# Patient Record
Sex: Female | Born: 1975 | Race: White | Hispanic: No | Marital: Single | Smoking: Former smoker
Health system: Southern US, Community
[De-identification: ages and names within clinical notes are randomized; demographics above are authoritative.]

## PROBLEM LIST (undated history)

## (undated) DIAGNOSIS — F329 Major depressive disorder, single episode, unspecified: Secondary | ICD-10-CM

## (undated) DIAGNOSIS — G4730 Sleep apnea, unspecified: Secondary | ICD-10-CM

## (undated) DIAGNOSIS — K635 Polyp of colon: Secondary | ICD-10-CM

## (undated) DIAGNOSIS — F32A Depression, unspecified: Secondary | ICD-10-CM

## (undated) DIAGNOSIS — E039 Hypothyroidism, unspecified: Secondary | ICD-10-CM

## (undated) DIAGNOSIS — F419 Anxiety disorder, unspecified: Secondary | ICD-10-CM

## (undated) HISTORY — PX: CYST EXCISION: SHX5701

## (undated) HISTORY — DX: Anxiety disorder, unspecified: F41.9

## (undated) HISTORY — PX: FOOT FRACTURE SURGERY: SHX645

## (undated) HISTORY — PX: ANTERIOR AND POSTERIOR REPAIR: SHX1172

## (undated) HISTORY — DX: Sleep apnea, unspecified: G47.30

## (undated) HISTORY — DX: Major depressive disorder, single episode, unspecified: F32.9

## (undated) HISTORY — PX: PARTIAL HYSTERECTOMY: SHX80

## (undated) HISTORY — DX: Depression, unspecified: F32.A

## (undated) HISTORY — PX: TONSILLECTOMY AND ADENOIDECTOMY: SUR1326

## (undated) HISTORY — DX: Polyp of colon: K63.5

## (undated) HISTORY — DX: Hypothyroidism, unspecified: E03.9

---

## 2011-08-14 ENCOUNTER — Ambulatory Visit (HOSPITAL_BASED_OUTPATIENT_CLINIC_OR_DEPARTMENT_OTHER)
Admission: RE | Admit: 2011-08-14 | Discharge: 2011-08-14 | Disposition: A | Discharge status: Home / Self Care | Payer: Medicaid Other | Source: Ambulatory Visit | Attending: Orthopedic Surgery | Admitting: Orthopedic Surgery

## 2011-08-14 DIAGNOSIS — E039 Hypothyroidism, unspecified: Secondary | ICD-10-CM | POA: Insufficient documentation

## 2011-08-14 DIAGNOSIS — M6740 Ganglion, unspecified site: Secondary | ICD-10-CM | POA: Insufficient documentation

## 2011-08-14 DIAGNOSIS — F17200 Nicotine dependence, unspecified, uncomplicated: Secondary | ICD-10-CM | POA: Insufficient documentation

## 2011-08-15 ENCOUNTER — Other Ambulatory Visit: Payer: Self-pay | Admitting: Orthopedic Surgery

## 2011-08-23 NOTE — Op Note (Signed)
  NAMEAMAIYA, SCRUTON          ACCOUNT NO.:  [id number]  MEDICAL RECORD NO.:  [id number]  LOCATION:                                 FACILITY:  PHYSICIAN:  Cindee Salt, M.D.       DATE OF BIRTH:  [birth year]  DATE OF PROCEDURE:  08/14/2011 DATE OF DISCHARGE:                              OPERATIVE REPORT   PREOPERATIVE DIAGNOSIS:  Dorsal wrist ganglion, right wrist.  POSTOPERATIVE DIAGNOSIS:  Dorsal wrist ganglion, right wrist.  OPERATION:  Excisional biopsy, dorsal wrist ganglion, right wrist.  SURGEON:  Cindee Salt, MD  ANESTHESIA:  General.  ANESTHESIOLOGIST:  Germaine Pomfret, MD  HISTORY:  The patient is a 35 year old female with a history of a large mass over the dorsal aspect of her right wrist.  She is desirous to having this removed.  Pre, peri, and postoperative course have been discussed along with risks and complications.  She is aware there is no guarantee with surgery, possibility of infection, recurrence to injuryto arteries, nerves, tendons, incomplete relief of symptoms and dystrophy.  In the preoperative area, the patient is seen, the extremity marked by both the patient and surgeon, and antibiotic given.  PROCEDURE:  The patient was brought to the operating room where general anesthetic was carried out without difficulty.  She was prepped using ChloraPrep, supine position, right arm free.  A 3-minute dry-time was allowed.  Time-out taken, confirming the patient and procedure.  A transverse incision was made over the mass, carried down through the subcutaneous tissue.  Bleeders were electrocauterized with bipolar. Dorsal sensory nerves were identified and protected.  Dissection carried down, following it through the extensor retinaculum, which it had split. This was followed with down to the scapholunate ligament complex.  This was opened.  A stalk followed, excised.  The capsule was opened and debrided.  The wound was irrigated.  The capsule was then  closed with figure-of-eight 4-0 Vicryl sutures, subcutaneous tissue and retinaculum and in layers with 4-0 Vicryl and the skin with subcuticular 4-0 Vicryl Rapide sutures.  A local infiltration of 0.25% Marcaine without epinephrine was given, approximately 5 mL was used.  A sterile compressive dressing and dorsal volar splint was applied.  On deflation of the tourniquet, all fingers were immediately pinked.  She was taken to the recovery room for observation in satisfactory condition.  She will be discharged to home to return to the [facility] in 1 week, on Vicodin.          ______________________________ Cindee Salt, M.D.     GK/MEDQ  D:  08/14/2011  T:  08/15/2011  Job:  [id number]  Electronically Signed by Cindee Salt M.D. on 08/23/2011 03:49:12 PM

## 2012-02-19 ENCOUNTER — Ambulatory Visit: Payer: Medicaid Other | Admitting: Internal Medicine

## 2012-03-03 ENCOUNTER — Ambulatory Visit: Payer: Medicaid Other | Admitting: Gastroenterology

## 2012-03-03 ENCOUNTER — Telehealth: Payer: Self-pay | Admitting: Gastroenterology

## 2012-03-03 NOTE — Telephone Encounter (Signed)
Message copied by Arna Snipe on Tue Mar 03, 2012  3:41 PM ------      Message from: Donata Duff      Created: Tue Mar 03, 2012  2:27 PM       Do not bill

## 2012-12-22 ENCOUNTER — Encounter: Payer: Self-pay | Admitting: Gastroenterology

## 2013-01-01 ENCOUNTER — Encounter: Payer: Self-pay | Admitting: *Deleted

## 2013-01-05 ENCOUNTER — Encounter: Payer: Medicaid Other | Admitting: Obstetrics & Gynecology

## 2013-01-08 ENCOUNTER — Ambulatory Visit (INDEPENDENT_AMBULATORY_CARE_PROVIDER_SITE_OTHER): Payer: Medicaid Other | Admitting: Gastroenterology

## 2013-01-08 ENCOUNTER — Encounter: Payer: Self-pay | Admitting: Gastroenterology

## 2013-01-08 VITALS — BP 118/78 | HR 73 | Ht 64.0 in | Wt 174.0 lb

## 2013-01-08 DIAGNOSIS — R194 Change in bowel habit: Secondary | ICD-10-CM

## 2013-01-08 DIAGNOSIS — Z860100 Personal history of colon polyps, unspecified: Secondary | ICD-10-CM

## 2013-01-08 DIAGNOSIS — K5900 Constipation, unspecified: Secondary | ICD-10-CM

## 2013-01-08 DIAGNOSIS — R198 Other specified symptoms and signs involving the digestive system and abdomen: Secondary | ICD-10-CM

## 2013-01-08 DIAGNOSIS — M549 Dorsalgia, unspecified: Secondary | ICD-10-CM

## 2013-01-08 DIAGNOSIS — Z8719 Personal history of other diseases of the digestive system: Secondary | ICD-10-CM

## 2013-01-08 MED ORDER — MOVIPREP 100 G PO SOLR: 1.0000 | Freq: Once | ORAL | Status: DC

## 2013-01-08 MED ORDER — LINACLOTIDE 290 MCG PO CAPS: 1.0000 | ORAL_CAPSULE | Freq: Every day | ORAL | Status: DC

## 2013-01-08 NOTE — Progress Notes (Addendum)
History of Present Illness:  This is a nice 37 year old Caucasian female status post hysterectomy who has some low-grade chronic pelvic pain of unexplained etiology.  She also describes pain in her sacroiliac area made better by having a bowel movement.  She has chronic functional constipation with occasional diarrhea, but denies melena or hematochezia.  She is status post 2 colonoscopies with removal of adenomatous colon polyps, and is due for followup exam at this time.  Her previous exams weren't [facility].  The patient has a history of colon polyps in her father.  There is no history of hepatitis, pancreatitis, and she denies upper GI or hepatobiliary complaints or any specific food allergies, anorexia, weight loss, or systemic complaints.  He is hypothyroid and is on daily Synthroid.  Other problems include mild depression and anxiety, and she is on citalopram 40 mg a day and BuSpar in milligrams once a day.  She denies a history of rectal fissures, fistula, hemorrhoids, or rectal pain.  She has an upcoming gynecologic appointment scheduled.  I have reviewed this patient's present history, medical and surgical past history, allergies and medications.     ROS:   All systems were reviewed and are negative unless otherwise stated in the HPI.    Physical Exam: Healthy-appearing patient in no distress.  Blood pressure 118/78, pulse 73 and weight 174 with a BMI of 29.85.  General well developed well nourished patient in no acute distress, appearing their stated age Eyes PERRLA, no icterus, fundoscopic exam per opthamologist Skin no lesions noted Neck supple, no adenopathy, no thyroid enlargement, no tenderness Chest clear to percussion and auscultation Heart no significant murmurs, gallops or rubs noted Abdomen no hepatosplenomegaly masses or tenderness, BS normal.  Rectal inspection normal no fissures, or fistulae noted.  No masses or tenderness on digital exam. Stool guaiac negative.   Good rectal tone noted without fissures or fistulae, masses or tenderness. Extremities no acute joint lesions, edema, phlebitis or evidence of cellulitis. Neurologic patient oriented x 3, cranial nerves intact, no focal neurologic deficits noted. Psychological mental status normal and normal affect.  Assessment and plan: Chronic functional constipation in a patient has a history of recurrent colon polyps.  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  Colonoscopies but scheduled her convenience.  I've urged her to keep her gynecologic appointment and to take other medications as listed and reviewed.  No diagnosis found.

## 2013-01-08 NOTE — Patient Instructions (Addendum)
You have been given a separate informational sheet regarding your tobacco use, the importance of quitting and local resources to help you quit.  You have been scheduled for a colonoscopy with propofol. Please follow written instructions given to you at your visit today.  Please pick up your prep kit at the pharmacy within the next 1-3 days. If you use inhalers (even only as needed), please bring them with you on the day of your procedure.  Please purchase Metamucil over the counter. Take as directed.  Please follow high fiber diet below. ________________________________________________________________________________________  High-Fiber Diet Fiber is found in fruits, vegetables, and grains. A high-fiber diet encourages the addition of more whole grains, legumes, fruits, and vegetables in your diet. The recommended amount of fiber for adult males is 38 g per day. For adult females, it is 25 g per day. Pregnant and lactating women should get 28 g of fiber per day. If you have a digestive or bowel problem, ask your caregiver for advice before adding high-fiber foods to your diet. Eat a variety of high-fiber foods instead of only a select few type of foods.  PURPOSE  To increase stool bulk.  To make bowel movements more regular to prevent constipation.  To lower cholesterol.  To prevent overeating. WHEN IS THIS DIET USED?  It may be used if you have constipation and hemorrhoids.  It may be used if you have uncomplicated diverticulosis (intestine condition) and irritable bowel syndrome.  It may be used if you need help with weight management.  It may be used if you want to add it to your diet as a protective measure against atherosclerosis, diabetes, and cancer. SOURCES OF FIBER  Whole-grain breads and cereals.  Fruits, such as apples, oranges, bananas, berries, prunes, and pears.  Vegetables, such as green peas, carrots, sweet potatoes, beets, broccoli, cabbage, spinach, and  artichokes.  Legumes, such split peas, soy, lentils.  Almonds. FIBER CONTENT IN FOODS Starches and Grains / Dietary Fiber (g)  Cheerios, 1 cup / 3 g  Corn Flakes cereal, 1 cup / 0.7 g  Rice crispy treat cereal, 1 cup / 0.3 g  Instant oatmeal (cooked),  cup / 2 g  Frosted wheat cereal, 1 cup / 5.1 g  Brown, long-grain rice (cooked), 1 cup / 3.5 g  White, long-grain rice (cooked), 1 cup / 0.6 g  Enriched macaroni (cooked), 1 cup / 2.5 g Legumes / Dietary Fiber (g)  Baked beans (canned, plain, or vegetarian),  cup / 5.2 g  Kidney beans (canned),  cup / 6.8 g  Pinto beans (cooked),  cup / 5.5 g Breads and Crackers / Dietary Fiber (g)  Plain or honey graham crackers, 2 squares / 0.7 g  Saltine crackers, 3 squares / 0.3 g  Plain, salted pretzels, 10 pieces / 1.8 g  Whole-wheat bread, 1 slice / 1.9 g  White bread, 1 slice / 0.7 g  Raisin bread, 1 slice / 1.2 g  Plain bagel, 3 oz / 2 g  Flour tortilla, 1 oz / 0.9 g  Corn tortilla, 1 small / 1.5 g  Hamburger or hotdog bun, 1 small / 0.9 g Fruits / Dietary Fiber (g)  Apple with skin, 1 medium / 4.4 g  Sweetened applesauce,  cup / 1.5 g  Banana,  medium / 1.5 g  Grapes, 10 grapes / 0.4 g  Orange, 1 small / 2.3 g  Raisin, 1.5 oz / 1.6 g  Melon, 1 cup / 1.4 g Vegetables /  Dietary Fiber (g)  Green beans (canned),  cup / 1.3 g  Carrots (cooked),  cup / 2.3 g  Broccoli (cooked),  cup / 2.8 g  Peas (cooked),  cup / 4.4 g  Mashed potatoes,  cup / 1.6 g  Lettuce, 1 cup / 0.5 g  Corn (canned),  cup / 1.6 g  Tomato,  cup / 1.1 g Document Released: 10/07/2005 Document Revised: 04/07/2012 Document Reviewed: 01/09/2012 ExitCare Patient Information 2013 ExitCare, Arsenio Loader. _____________________________________________________________________________________________________________________                                               We are excited to introduce MyChart, a new best-in-class  service that provides you online access to important information in your electronic medical record. We want to make it easier for you to view your health information - all in one secure location - when and where you need it. We expect MyChart will enhance the quality of care and service we provide.  When you register for MyChart, you can:    View your test results.    Request appointments and receive appointment reminders via email.    Request medication renewals.    View your medical history, allergies, medications and immunizations.    Communicate with your physician's office through a password-protected site.    Conveniently print information such as your medication lists.  To find out if MyChart is right for you, please talk to a member of our clinical staff today. We will gladly answer your questions about this free health and wellness tool.  If you are age 61 or older and want a member of your family to have access to your record, you must provide written consent by completing a proxy form available at our office. Please speak to our clinical staff about guidelines regarding accounts for patients younger than age 58.  As you activate your MyChart account and need any technical assistance, please call the MyChart technical support line at [phone]) or email your question to [email or web address]. If you email your question(s), please include your name, a return phone number and the best time to reach you.  If you have non-urgent health-related questions, you can send a message to our office through MyChart at [email or web address]. If you have a medical emergency, call 911.  Thank you for using MyChart as your new health and wellness resource!   MyChart licensed from [employer],  [id number]. Patents Pending.

## 2013-01-11 ENCOUNTER — Encounter: Payer: Medicaid Other | Admitting: Gastroenterology

## 2013-01-11 ENCOUNTER — Encounter: Payer: Self-pay | Admitting: *Deleted

## 2013-01-11 ENCOUNTER — Telehealth: Payer: Self-pay | Admitting: Nurse Practitioner

## 2013-01-11 NOTE — Telephone Encounter (Signed)
APPT MADE FOR TOM

## 2013-01-11 NOTE — Telephone Encounter (Signed)
Needs to be seen today.  Her lower back is hurting really bad.

## 2013-01-11 NOTE — Progress Notes (Signed)
Patient ID: Krystal Mckee, female   DOB: Oct 09, 1976, 37 y.o.   MRN: [id number] PT REFUSED APPT FOR TOM.

## 2013-01-12 ENCOUNTER — Encounter: Payer: Self-pay | Admitting: Obstetrics & Gynecology

## 2013-01-12 ENCOUNTER — Ambulatory Visit (INDEPENDENT_AMBULATORY_CARE_PROVIDER_SITE_OTHER): Payer: Medicaid Other | Admitting: Obstetrics & Gynecology

## 2013-01-12 VITALS — BP 106/68 | HR 77 | Resp 16 | Ht 64.0 in | Wt 175.0 lb

## 2013-01-12 DIAGNOSIS — IMO0002 Reserved for concepts with insufficient information to code with codable children: Secondary | ICD-10-CM | POA: Insufficient documentation

## 2013-01-12 DIAGNOSIS — K589 Irritable bowel syndrome without diarrhea: Secondary | ICD-10-CM

## 2013-01-12 NOTE — Patient Instructions (Signed)
Dyspareunia Dyspareunia is pain during sexual intercourse. It is most common in women, but it also happens in men.  CAUSES  Female The pain from this condition is usually felt when anything is put into the vagina, but any part of the genitals may cause pain during sex. Even sitting or wearing pants can cause pain. Sometimes, a cause cannot be found. Some causes of pain during intercourse are:  Infections of the skin around the vagina.  Vaginal infections, such as a yeast, bacterial, or viral infection.  Vaginismus. This is the inability to have anything put in the vagina even when the woman wants it to happen. There is an automatic muscle contraction and pain. The pain of the muscle contraction can be so severe that intercourse is impossible.  Allergic reaction from spermicides, semen, condoms, scented tampons, soaps, douches, and vaginal sprays.  A fluid-filled sac (cyst) on the Bartholin or Skene glands, located at the opening of the vagina.  Scar tissue in the vagina from a surgically enlarged opening (episiotomy) or tearing after delivering a baby.  Vaginal dryness. This is more common in menopause. The normal secretions of the vagina are decreased. Changes in estrogen levels and increased difficulty becoming aroused can cause painful sex. Vaginal dryness can also happen when taking birth control pills.  Thinning of the tissue (atrophy) of the vulva and vagina. This makes the area thinner, smaller, unable to stretch to accommodate a penis, and prone to infection and tearing.  Vulvar vestibulitis or vestibulodynia.This is a condition that causes pain involving the area around the entrance to the vagina.The most common cause in young women is birth control pills.Women with low estrogen levels (postmenopausal women) may also experience this.Other causes include allergic reactions, too many nerve endings, skin conditions, and pelvic muscles that cannot relax.  Vulvar dermatoses. This  includes skin conditions such as lichen sclerosus and lichen planus.  Lack of foreplay to lubricate the vagina. This can cause vaginal dryness.  Noncancerous tumors (fibroids) in the uterus.  Uterus lining tissue growing outside the uterus (endometriosis).  Pregnancy that starts in the fallopian tube (tubal pregnancy).  Pregnancy or breastfeeding your baby. This can cause vaginal dryness.  A tilting or prolapse of the uterus. Prolapse is when weak and stretched muscles around the uterus allow it to fall into the vagina.  Problems with the ovaries, cysts, or scar tissue. This may be worse with certain sexual positions.  Previous surgeries causing adhesions or scar tissue in the vagina or pelvis.  Bladder and intestinal problems.  Psychological problems (such as depression or anxiety). This may make pain worse.  Negative attitudes about sex, experiencing rape, sexual assault, and misinformation about sex. These issues are often related to some types of pain.  Previous pelvic infection, causing scar tissue in the pelvis and on the female organs.  Cyst or tumor on the ovary.  Cancer of the female organs.  Certain medicines.  Medical problems such as diabetes, arthritis, or thyroid disease. Female In men, there are many physical causes of sexual discomfort. Some causes of pain during intercourse are:  Infections of the prostate, bladder, or seminal vesicles. This can cause pain after ejaculation.  An inflamed bladder (interstitial cystitis). This may cause pain from ejaculation.  Gonorrheal infections. This may cause pain during ejaculation.  An inflamed urethra (urethritis) or inflamed prostate (prostatitis). This can make genital stimulation painful or uncomfortable.  Deformities of the penis, such as Peyronie's disease.  A tight foreskin.  Cancer of the female organs.    Psychological problems. This may make pain worse. DIAGNOSIS   Your caregiver will take a history and  have you describe where the pain is located (outside the vagina, in the vagina, in the pelvis). You may be asked when you experience pain, such as with penetration or with thrusting.  Following this, your caregiver will do a physical exam. Let your caregiver know if the exam is too painful.  During the final part of the female exam, your caregiver will feel your uterus and ovaries with one hand on the abdomen and one finger in your vagina. This is a pelvic exam.  Blood tests, a Pap test, cultures for infection, an ultrasound test, and X-rays may be done. You may need to see a specialist for female problems (gynecologist).  Your caregiver may do a CT scan, MRI, or laparoscopy. Laparoscopy is a procedure to look into the pelvis with a lighted tube, through a cut (incision) in the abdomen. TREATMENT  Your caregiver can help you determine the best course of treatment. Sometimes, more testing is done. Continue with the suggested testing until your caregiver feels sure about your diagnosis and how to treat it. Sometimes, it is difficult to find the reason for the pain. The search for the cause and treatment can be frustrating. Treatment often takes several weeks to a few months before you notice any improvement. You may also need to avoid sexual activity until symptoms improve.Continuing to have sex when it hurts can delay healing and actually make the problem worse. The treatment depends on the cause of the pain. Treatment may include:  Medicines such as antibiotics, vaginal or skin creams, hormones, or antidepressants.  Minor or major surgery.  Psychological counseling or group therapy.  Kegel exercises and vaginal dilators to help certain cases of vaginismus (spasms). Do this only if recommended by your caregiver.Kegel exercises can make some problems worse.  Applying lubrication as recommended by your caregiver if you have dryness.  Sex therapy for you and your sex partner. It is common for  the pain to continue after the reason for the pain has been treated. Some reasons for this include a conditioned response. This means the person having the pain becomes so familiar with the pain that the pain continues as a response, even though the cause is removed. Sex therapy can help with this problem. HOME CARE INSTRUCTIONS   Follow your caregiver's instructions about taking medicines, tests, counseling, and follow-up treatment.  Do not use scented tampons, douches, vaginal sprays, or soaps.  Use water-based lubricants for dryness. Oil lubricants can cause irritation.  Do not use spermicides or condoms that irritate you.  Openly discuss with your partner your sexual experience, your desires, foreplay, and different sexual positions for a more comfortable and enjoyable sexual relationship.  Join group sessions for therapy, if needed.  Practice safe sex at all times.  Empty your bladder before having intercourse.  Try different positions during sexual intercourse.  Take over-the-counter pain medicine recommended by your caregiver before having sexual intercourse.  Do not wear pantyhose. Knee-high and thigh-high hose are okay.  Avoid scrubbing your vulva with a washcloth. Wash the area gently and pat dry with a towel. SEEK MEDICAL CARE IF:   You develop vaginal bleeding after sexual intercourse.  You develop a lump at the opening of your vagina, even if it is not painful.  You have abnormal vaginal discharge.  You have vaginal dryness.  You have itching or irritation of the vulva or vagina.  You   develop a rash or reaction to your medicine. SEEK IMMEDIATE MEDICAL CARE IF:   You develop severe abdominal pain during or shortly after sexual intercourse. You could have a ruptured ovarian cyst or ruptured tubal pregnancy.  You have a fever.  You have painful or bloody urination.  You have painful sexual intercourse, and you never had it before.  You pass out after having  sexual intercourse. Document Released: 10/27/2007 Document Revised: 12/30/2011 Document Reviewed: 01/07/2011 ExitCare Patient Information 2013 [employer].  

## 2013-01-12 NOTE — Progress Notes (Signed)
Patient ID: Krystal Mckee, female   DOB: 1976/02/01, 37 y.o.   MRN: [id number]  Chief Complaint  Patient presents with  . Dyspareunia    Last year has gotten worse    HPI Krystal Mckee is a 37 y.o. female.  [id number] No LMP recorded. Patient has had a hysterectomy. TVH Aand P repair in [place] several months after SVD 9 lb 15 oz baby, previous birth was CS. She had had pelvic pain with intercourse almost ever since and this is now worse. No discharge, prolapse, dysuria or frequency. HPI  Past Medical History  Diagnosis Date  . Depression   . Anxiety   . Hypothyroidism   . Colon polyps     Past Surgical History  Procedure Laterality Date  . Partial hysterectomy    . Tonsillectomy and adenoidectomy    . Cyst excision      Right wrist  . Cyst excision      Left knee  . Cesarean section      Family History  Problem Relation Age of Onset  . Colon polyps Father   . Ovarian cancer Mother   . Diabetes Maternal Grandmother     Social History History  Substance Use Topics  . Smoking status: Current Every Day Smoker  . Smokeless tobacco: Never Used  . Alcohol Use: No    Allergies  Allergen Reactions  . Penicillins     Current Outpatient Prescriptions  Medication Sig Dispense Refill  . busPIRone (BUSPAR) 10 MG tablet Take 10 mg by mouth daily.      . citalopram (CELEXA) 40 MG tablet Take 40 mg by mouth daily.      Marland Kitchen levothyroxine (SYNTHROID) 25 MCG tablet Take 25 mcg by mouth daily.      . Linaclotide (LINZESS) 290 MCG CAPS Take 1 capsule by mouth daily.  18 capsule  0  . MOVIPREP 100 G SOLR Take 1 kit (100 g total) by mouth once.  1 kit  0   No current facility-administered medications for this visit.    Review of Systems Review of Systems  Constitutional: Negative.   Genitourinary: Positive for pelvic pain (dyspareunia, occasionally with certain movements). Negative for vaginal bleeding, vaginal discharge and vaginal pain.    Blood pressure 106/68,  pulse 77, resp. rate 16, height 5\' 4"  (1.626 m), weight 175 lb (79.379 kg).  Physical Exam Physical Exam  Constitutional: She is oriented to person, place, and time. She appears well-developed. No distress.  Abdominal: Soft. There is no tenderness.  Transverse scar low abd  Genitourinary: Vagina normal.  Cuff good support, no mass, mild s/p tenderness  Neurological: She is alert and oriented to person, place, and time.  Skin: Skin is warm and dry.  Psychiatric: She has a normal mood and affect. Her behavior is normal.    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

## 2013-01-18 ENCOUNTER — Other Ambulatory Visit (HOSPITAL_COMMUNITY): Payer: Medicaid Other

## 2013-01-18 ENCOUNTER — Ambulatory Visit (AMBULATORY_SURGERY_CENTER): Payer: Medicaid Other | Admitting: Gastroenterology

## 2013-01-18 ENCOUNTER — Encounter: Payer: Self-pay | Admitting: Gastroenterology

## 2013-01-18 ENCOUNTER — Other Ambulatory Visit: Payer: Self-pay | Admitting: *Deleted

## 2013-01-18 VITALS — BP 110/70 | HR 62 | Temp 97.3°F | Resp 18 | Ht 64.0 in | Wt 174.0 lb

## 2013-01-18 DIAGNOSIS — Z1211 Encounter for screening for malignant neoplasm of colon: Secondary | ICD-10-CM

## 2013-01-18 DIAGNOSIS — Z86010 Personal history of colonic polyps: Secondary | ICD-10-CM

## 2013-01-18 DIAGNOSIS — R079 Chest pain, unspecified: Secondary | ICD-10-CM

## 2013-01-18 DIAGNOSIS — K589 Irritable bowel syndrome without diarrhea: Secondary | ICD-10-CM

## 2013-01-18 HISTORY — PX: COLONOSCOPY: SHX174

## 2013-01-18 MED ORDER — SODIUM CHLORIDE 0.9 % IV SOLN: 500.0000 mL | INTRAVENOUS | Status: DC

## 2013-01-18 NOTE — Patient Instructions (Addendum)
YOU HAD AN ENDOSCOPIC PROCEDURE TODAY AT THE [facility]: Refer to the procedure report that was given to you for any specific questions about what was found during the examination.  If the procedure report does not answer your questions, please call your gastroenterologist to clarify.  If you requested that your care partner not be given the details of your procedure findings, then the procedure report has been included in a sealed envelope for you to review at your convenience later.  YOU SHOULD EXPECT: Some feelings of bloating in the abdomen. Passage of more gas than usual.  Walking can help get rid of the air that was put into your GI tract during the procedure and reduce the bloating. If you had a lower endoscopy (such as a colonoscopy or flexible sigmoidoscopy) you may notice spotting of blood in your stool or on the toilet paper. If you underwent a bowel prep for your procedure, then you may not have a normal bowel movement for a few days.  DIET: Your first meal following the procedure should be a light meal and then it is ok to progress to your normal diet.  A half-sandwich or bowl of soup is an example of a good first meal.  Heavy or fried foods are harder to digest and may make you feel nauseous or bloated.  Likewise meals heavy in dairy and vegetables can cause extra gas to form and this can also increase the bloating.  Drink plenty of fluids but you should avoid alcoholic beverages for 24 hours.  ACTIVITY: Your care partner should take you home directly after the procedure.  You should plan to take it easy, moving slowly for the rest of the day.  You can resume normal activity the day after the procedure however you should NOT DRIVE or use heavy machinery for 24 hours (because of the sedation medicines used during the test).    SYMPTOMS TO REPORT IMMEDIATELY: A gastroenterologist can be reached at any hour.  During normal business hours, 8:30 AM to 5:00 PM Monday through Friday,  call [phone].  After hours and on weekends, please call the GI answering service at [phone] who will take a message and have the physician on call contact you.   Following lower endoscopy (colonoscopy or flexible sigmoidoscopy):  Excessive amounts of blood in the stool  Significant tenderness or worsening of abdominal pains  Swelling of the abdomen that is new, acute  Fever of 100F or higher  Following upper endoscopy (EGD)  Vomiting of blood or coffee ground material  New chest pain or pain under the shoulder blades  Painful or persistently difficult swallowing  New shortness of breath  Fever of 100F or higher  Black, tarry-looking stools  FOLLOW UP: If any biopsies were taken you will be contacted by phone or by letter within the next 1-3 weeks.  Call your gastroenterologist if you have not heard about the biopsies in 3 weeks.  Our staff will call the home number listed on your records the next business day following your procedure to check on you and address any questions or concerns that you may have at that time regarding the information given to you following your procedure. This is a courtesy call and so if there is no answer at the home number and we have not heard from you through the emergency physician on call, we will assume that you have returned to your regular daily activities without incident.  SIGNATURES/CONFIDENTIALITY: You and/or your care   partner have signed paperwork which will be entered into your electronic medical record.  These signatures attest to the fact that that the information above on your After Visit Summary has been reviewed and is understood.  Full responsibility of the confidentiality of this discharge information lies with you and/or your care-partner.  

## 2013-01-18 NOTE — Progress Notes (Signed)
Patient did not experience any of the following events: a burn prior to discharge; a fall within the facility; wrong site/side/patient/procedure/implant event; or a hospital transfer or hospital admission upon discharge from the facility. (G8907) Patient did not have preoperative order for IV antibiotic SSI prophylaxis. (G8918)  

## 2013-01-18 NOTE — Op Note (Signed)
[facility] [address] [place], [address]   COLONOSCOPY PROCEDURE REPORT  PATIENT: Krystal Mckee, Krystal Mckee  MR#: [id number] BIRTHDATE: 1976-02-12 , 36  yrs. old GENDER: Female ENDOSCOPIST: Mardella Layman, MD, [facility] REFERRED BY:  Wardell Heath, N.P. PROCEDURE DATE:  01/18/2013 PROCEDURE:   Colonoscopy, screening ASA CLASS:   Class II INDICATIONS:Colorectal cancer screening and Constipation. MEDICATIONS: propofol (Diprivan) 200mg  IV and Robinul 0.2 mg IV  DESCRIPTION OF PROCEDURE:   After the risks and benefits and of the procedure were explained, informed consent was obtained.  A digital rectal exam revealed no abnormalities of the rectum.    The LB [id number]  endoscope was introduced through the anus and advanced to the cecum, which was identified by both the appendix and ileocecal valve .  The quality of the prep was excellent, using MoviPrep .  The instrument was then slowly withdrawn as the colon was fully examined.     COLON FINDINGS: A normal appearing cecum, ileocecal valve, and appendiceal orifice were identified.  The ascending, hepatic flexure, transverse, splenic flexure, descending, sigmoid colon and rectum appeared unremarkable.  No polyps or cancers were seen. The scope was then withdrawn from the patient and the procedure completed.  COMPLICATIONS: There were no complications. ENDOSCOPIC IMPRESSION: Normal colon ..no polyps,IBd,etc..Marland KitchenClinical picture c/w slow-transit constipation.  RECOMMENDATIONS: 1.  Continue current medications 2.  Continue current colorectal screening recommendations for "routine risk" patients with a repeat colonoscopy in 10 years.   REPEAT EXAM:  cc:  _______________________________ eSignedMardella Layman, MD, [facility] 01/18/2013 3:15 PM

## 2013-01-19 ENCOUNTER — Ambulatory Visit (HOSPITAL_COMMUNITY): Payer: Medicaid Other

## 2013-01-19 ENCOUNTER — Telehealth: Payer: Self-pay | Admitting: *Deleted

## 2013-01-19 ENCOUNTER — Ambulatory Visit (HOSPITAL_COMMUNITY)
Admission: RE | Admit: 2013-01-19 | Discharge: 2013-01-19 | Disposition: A | Discharge status: Home / Self Care | Payer: Medicaid Other | Source: Ambulatory Visit | Attending: Obstetrics & Gynecology | Admitting: Obstetrics & Gynecology

## 2013-01-19 DIAGNOSIS — IMO0002 Reserved for concepts with insufficient information to code with codable children: Secondary | ICD-10-CM | POA: Insufficient documentation

## 2013-01-19 NOTE — Telephone Encounter (Signed)
  Follow up Call-  Call back number 01/18/2013  Post procedure Call Back phone  # [phone]  Permission to leave phone message Yes     Patient questions:  Left message to call if necessary.

## 2013-01-26 ENCOUNTER — Encounter: Payer: Self-pay | Admitting: Obstetrics & Gynecology

## 2013-01-26 ENCOUNTER — Ambulatory Visit (INDEPENDENT_AMBULATORY_CARE_PROVIDER_SITE_OTHER): Payer: Medicaid Other | Admitting: Obstetrics & Gynecology

## 2013-01-26 VITALS — BP 114/72 | HR 88 | Resp 16 | Wt 174.0 lb

## 2013-01-26 DIAGNOSIS — IMO0002 Reserved for concepts with insufficient information to code with codable children: Secondary | ICD-10-CM

## 2013-01-26 MED ORDER — GABAPENTIN 300 MG PO CAPS: ORAL_CAPSULE | ORAL | Status: DC

## 2013-01-26 NOTE — Progress Notes (Signed)
  Subjective:    Patient ID: Krystal Mckee, female    DOB: 1976/07/08, 37 y.o.   MRN: [id number]  HPI [id number] No LMP recorded. Patient has had a hysterectomy. Pelvic US and colonoscopy were normal. Her only urinary sx is urge to urinate during intercourse.     Review of Systems  Genitourinary: Positive for pelvic pain and dyspareunia. Negative for dysuria, urgency and vaginal discharge.       Objective:   Physical Exam  Nursing note and vitals reviewed. Constitutional: She appears well-developed. No distress.  Psychiatric: She has a normal mood and affect. Her behavior is normal.          Assessment & Plan:  Dyspareunia after TVH  A&P repair, nl ovaries on [place] Constipaition, mood d/o on meds  Trial of gabapentin RTC 6 weeks   Adam Phenix, MD 01/26/2013

## 2013-01-26 NOTE — Patient Instructions (Signed)
Gabapentin capsules or tablets What is this medicine? GABAPENTIN (GA ba pen tin) is used to control partial seizures in adults with epilepsy. It is also used to treat certain types of nerve pain. This medicine may be used for other purposes; ask your health care provider or pharmacist if you have questions. What should I tell my health care provider before I take this medicine? They need to know if you have any of these conditions: -kidney disease -suicidal thoughts, plans, or attempt; a previous suicide attempt by you or a family member -an unusual or allergic reaction to gabapentin, other medicines, foods, dyes, or preservatives -pregnant or trying to get pregnant -breast-feeding How should I use this medicine? Take this medicine by mouth. Swallow it with a drink of water. Follow the directions on the prescription label. If this medicine upsets your stomach, take it with food or milk. Take your medicine at regular intervals. Do not take it more often than directed. If you are directed to break the 600 or 800 mg tablets in half as part of your dose, the extra half tablet should be used for the next dose. If you have not used the extra half tablet within 3 days, it should be thrown away. A special MedGuide will be given to you by the pharmacist with each prescription and refill. Be sure to read this information carefully each time. Talk to your pediatrician regarding the use of this medicine in children. Special care may be needed. Overdosage: If you think you have taken too much of this medicine contact a poison control center or emergency room at once. NOTE: This medicine is only for you. Do not share this medicine with others. What if I miss a dose? If you miss a dose, take it as soon as you can. If it is almost time for your next dose, take only that dose. Do not take double or extra doses. What may interact with this medicine? -antacids -hydrocodone -morphine -naproxen -sevelamer This  list may not describe all possible interactions. Give your health care provider a list of all the medicines, herbs, non-prescription drugs, or dietary supplements you use. Also tell them if you smoke, drink alcohol, or use illegal drugs. Some items may interact with your medicine. What should I watch for while using this medicine? Visit your doctor or health care professional for regular checks on your progress. You may want to keep a record at home of how you feel your condition is responding to treatment. You may want to share this information with your doctor or health care professional at each visit. You should contact your doctor or health care professional if your seizures get worse or if you have any new types of seizures. Do not stop taking this medicine or any of your seizure medicines unless instructed by your doctor or health care professional. Stopping your medicine suddenly can increase your seizures or their severity. Wear a medical identification bracelet or chain if you are taking this medicine for seizures, and carry a card that lists all your medications. You may get drowsy, dizzy, or have blurred vision. Do not drive, use machinery, or do anything that needs mental alertness until you know how this medicine affects you. To reduce dizzy or fainting spells, do not sit or stand up quickly, especially if you are an older patient. Alcohol can increase drowsiness and dizziness. Avoid alcoholic drinks. Your mouth may get dry. Chewing sugarless gum or sucking hard candy, and drinking plenty of water will help.   The use of this medicine may increase the chance of suicidal thoughts or actions. Pay special attention to how you are responding while on this medicine. Any worsening of mood, or thoughts of suicide or dying should be reported to your health care professional right away. Women who become pregnant while using this medicine may enroll in the [place] American Antiepileptic Drug Pregnancy Registry  by calling 1-888-233-2334. This registry collects information about the safety of antiepileptic drug use during pregnancy. What side effects may I notice from receiving this medicine? Side effects that you should report to your doctor or health care professional as soon as possible: -allergic reactions like skin rash, itching or hives, swelling of the face, lips, or tongue -worsening of mood, thoughts or actions of suicide or dying Side effects that usually do not require medical attention (report to your doctor or health care professional if they continue or are bothersome): -constipation -difficulty walking or controlling muscle movements -nausea -slurred speech -tremors -weight gain This list may not describe all possible side effects. Call your doctor for medical advice about side effects. You may report side effects to FDA at 1-800-FDA-1088. Where should I keep my medicine? Keep out of reach of children. Store at room temperature between 15 and 30 degrees C (59 and 86 degrees F). Throw away any unused medicine after the expiration date. NOTE: This sheet is a summary. It may not cover all possible information. If you have questions about this medicine, talk to your doctor, pharmacist, or health care provider.  2012, Elsevier/Gold Standard. (06/05/2010 6:06:26 PM) 

## 2013-02-18 ENCOUNTER — Ambulatory Visit (INDEPENDENT_AMBULATORY_CARE_PROVIDER_SITE_OTHER): Payer: Medicaid Other | Admitting: Internal Medicine

## 2013-02-18 ENCOUNTER — Encounter: Payer: Self-pay | Admitting: Internal Medicine

## 2013-02-18 VITALS — BP 108/75 | HR 87 | Ht 64.0 in | Wt 177.2 lb

## 2013-02-18 DIAGNOSIS — R55 Syncope and collapse: Secondary | ICD-10-CM | POA: Insufficient documentation

## 2013-02-18 HISTORY — DX: Syncope and collapse: R55

## 2013-02-18 NOTE — Patient Instructions (Addendum)
Your physician recommends that you schedule a follow-up appointment in: 3-4 months with Dr Taylor  

## 2013-02-18 NOTE — Assessment & Plan Note (Signed)
The patient almost certainly has autonomic dysfunction. I discussed the mechanism of autonomic dysfunction with her in detail, as well as ways that she might reduce her episodes of syncope. I do not think head up tilt table testing would be helpful. I've asked the patient to increase her salt and fluid intake, and to avoid caffeine and alcohol. She is instructed to stop smoking and to start exercising daily. Finally, I've warned the patient to lie down when she feels an episode coming. Hopefully with all the above, her symptoms will be more manageable. I'll see her back in several months to see how she is progressing.

## 2013-02-18 NOTE — Progress Notes (Signed)
HPI Krystal Mckee is referred today by Dr. Jarold Motto for evaluation of bradycardia and syncope. The patient is a very pleasant 37 year old woman who's had a long-standing history of syncope. When she was a child, she passed out regularly. She went many years without passing out until 2 years ago. Now the patient has approximately 4 episodes a year where she passes out completely. She has other episodes where she will nearly pass out but her symptoms resolved.  These episodes start suddenly and are typically associated with some sort of noxious or unwanted stimulus. At times she will be seeing and get tired and then feel clammy and then passed out. Sometimes spills pass without resulting in frank syncope. At other times, she'll note that the room appears to be spinning somewhat. She thinks her episodes have worsened in the last several months. The patient smokes cigarettes and drinks caffeinated beverages. She denies alcohol use. After a syncopal episode, she will feel clammy and nauseated and weak for several hours if not a day. She has never bitten her tongue with a syncopal episode and denies loss of bowel or bladder continence. Allergies  Allergen Reactions  . Penicillins      Current Outpatient Prescriptions  Medication Sig Dispense Refill  . citalopram (CELEXA) 40 MG tablet Take 40 mg by mouth daily.      Marland Kitchen gabapentin (NEURONTIN) 300 MG capsule Take one the first day, then 1 bid for 1 day, thenn 1 po three times a day  90 capsule  3  . levothyroxine (SYNTHROID) 25 MCG tablet Take 25 mcg by mouth daily.       No current facility-administered medications for this visit.     Past Medical History  Diagnosis Date  . Depression   . Anxiety   . Hypothyroidism   . Colon polyps     ROS:   All systems reviewed and negative except as noted in the HPI.   Past Surgical History  Procedure Laterality Date  . Partial hysterectomy    . Tonsillectomy and adenoidectomy    . Cyst excision     Right wrist  . Cyst excision      Left knee  . Cesarean section    . Colonoscopy  01/18/13     Family History  Problem Relation Age of Onset  . Colon polyps Father   . Ovarian cancer Mother   . Diabetes Maternal Grandmother      History   Social History  . Marital Status: Unknown    Spouse Name: N/A    Number of Children: 3  . Years of Education: N/A   Occupational History  .     Social History Main Topics  . Smoking status: Current Every Day Smoker -- 0.25 packs/day  . Smokeless tobacco: Never Used  . Alcohol Use: No  . Drug Use: No  . Sexually Active: Yes    Birth Control/ Protection: Other-see comments     Comment: TVH 2001   Other Topics Concern  . Not on file   Social History Narrative  . No narrative on file     BP 108/75  Pulse 87  Ht 5\' 4"  (1.626 m)  Wt 177 lb 3.2 oz (80.377 kg)  BMI 30.4 kg/m2  Physical Exam:  Well appearing 37 year old woman,NAD HEENT: Unremarkable Neck:  No JVD, no thyromegally Back:  No CVA tenderness Lungs:  Clear with no wheezes, rales, or rhonchi. HEART:  Regular rate rhythm, no murmurs, no rubs, no clicks Abd:  soft,  positive bowel sounds, no organomegally, no rebound, no guarding Ext:  2 plus pulses, no edema, no cyanosis, no clubbing Skin:  No rashes no nodules Neuro:  CN II through XII intact, motor grossly intact  EKG - normal sinus rhythm with normal axis and intervals.  Assess/Plan:

## 2013-03-09 ENCOUNTER — Encounter: Payer: Self-pay | Admitting: Obstetrics & Gynecology

## 2013-03-09 ENCOUNTER — Ambulatory Visit (INDEPENDENT_AMBULATORY_CARE_PROVIDER_SITE_OTHER): Payer: Medicaid Other | Admitting: Obstetrics & Gynecology

## 2013-03-09 ENCOUNTER — Ambulatory Visit: Payer: Medicaid Other | Admitting: Obstetrics & Gynecology

## 2013-03-09 VITALS — BP 119/81 | HR 65 | Resp 16 | Wt 178.0 lb

## 2013-03-09 DIAGNOSIS — IMO0002 Reserved for concepts with insufficient information to code with codable children: Secondary | ICD-10-CM

## 2013-03-09 MED ORDER — CYCLOBENZAPRINE HCL 5 MG PO TABS: 5.0000 mg | ORAL_TABLET | Freq: Two times a day (BID) | ORAL | Status: DC | PRN

## 2013-03-09 NOTE — Progress Notes (Signed)
Patient ID: Krystal Mckee, female   DOB: 12/08/1975, 37 y.o.   MRN: [id number]  [id number] No LMP recorded. Patient has had a hysterectomy. S/P TVH A&P repair, with dyspareunia. Gabapentin helped some but ms relaxant Rx by urgent care left her sx free, no side effects. Willing to try change of therapy. Will D/C gabapentine and add Flexeril 5 mg BID prn.  Her pain may be due to pelvic floor ms spasm. RTC 3 months  Adam Phenix, MD 03/09/2013

## 2013-03-09 NOTE — Patient Instructions (Signed)
Dyspareunia Dyspareunia is pain during sexual intercourse. It is most common in women, but it also happens in men.  CAUSES  Female The pain from this condition is usually felt when anything is put into the vagina, but any part of the genitals may cause pain during sex. Even sitting or wearing pants can cause pain. Sometimes, a cause cannot be found. Some causes of pain during intercourse are:  Infections of the skin around the vagina.  Vaginal infections, such as a yeast, bacterial, or viral infection.  Vaginismus. This is the inability to have anything put in the vagina even when the woman wants it to happen. There is an automatic muscle contraction and pain. The pain of the muscle contraction can be so severe that intercourse is impossible.  Allergic reaction from spermicides, semen, condoms, scented tampons, soaps, douches, and vaginal sprays.  A fluid-filled sac (cyst) on the Bartholin or Skene glands, located at the opening of the vagina.  Scar tissue in the vagina from a surgically enlarged opening (episiotomy) or tearing after delivering a baby.  Vaginal dryness. This is more common in menopause. The normal secretions of the vagina are decreased. Changes in estrogen levels and increased difficulty becoming aroused can cause painful sex. Vaginal dryness can also happen when taking birth control pills.  Thinning of the tissue (atrophy) of the vulva and vagina. This makes the area thinner, smaller, unable to stretch to accommodate a penis, and prone to infection and tearing.  Vulvar vestibulitis or vestibulodynia.This is a condition that causes pain involving the area around the entrance to the vagina.The most common cause in young women is birth control pills.Women with low estrogen levels (postmenopausal women) may also experience this.Other causes include allergic reactions, too many nerve endings, skin conditions, and pelvic muscles that cannot relax.  Vulvar dermatoses. This  includes skin conditions such as lichen sclerosus and lichen planus.  Lack of foreplay to lubricate the vagina. This can cause vaginal dryness.  Noncancerous tumors (fibroids) in the uterus.  Uterus lining tissue growing outside the uterus (endometriosis).  Pregnancy that starts in the fallopian tube (tubal pregnancy).  Pregnancy or breastfeeding your baby. This can cause vaginal dryness.  A tilting or prolapse of the uterus. Prolapse is when weak and stretched muscles around the uterus allow it to fall into the vagina.  Problems with the ovaries, cysts, or scar tissue. This may be worse with certain sexual positions.  Previous surgeries causing adhesions or scar tissue in the vagina or pelvis.  Bladder and intestinal problems.  Psychological problems (such as depression or anxiety). This may make pain worse.  Negative attitudes about sex, experiencing rape, sexual assault, and misinformation about sex. These issues are often related to some types of pain.  Previous pelvic infection, causing scar tissue in the pelvis and on the female organs.  Cyst or tumor on the ovary.  Cancer of the female organs.  Certain medicines.  Medical problems such as diabetes, arthritis, or thyroid disease. Female In men, there are many physical causes of sexual discomfort. Some causes of pain during intercourse are:  Infections of the prostate, bladder, or seminal vesicles. This can cause pain after ejaculation.  An inflamed bladder (interstitial cystitis). This may cause pain from ejaculation.  Gonorrheal infections. This may cause pain during ejaculation.  An inflamed urethra (urethritis) or inflamed prostate (prostatitis). This can make genital stimulation painful or uncomfortable.  Deformities of the penis, such as Peyronie's disease.  A tight foreskin.  Cancer of the female organs.    Psychological problems. This may make pain worse. DIAGNOSIS   Your caregiver will take a history and  have you describe where the pain is located (outside the vagina, in the vagina, in the pelvis). You may be asked when you experience pain, such as with penetration or with thrusting.  Following this, your caregiver will do a physical exam. Let your caregiver know if the exam is too painful.  During the final part of the female exam, your caregiver will feel your uterus and ovaries with one hand on the abdomen and one finger in your vagina. This is a pelvic exam.  Blood tests, a Pap test, cultures for infection, an ultrasound test, and X-rays may be done. You may need to see a specialist for female problems (gynecologist).  Your caregiver may do a CT scan, MRI, or laparoscopy. Laparoscopy is a procedure to look into the pelvis with a lighted tube, through a cut (incision) in the abdomen. TREATMENT  Your caregiver can help you determine the best course of treatment. Sometimes, more testing is done. Continue with the suggested testing until your caregiver feels sure about your diagnosis and how to treat it. Sometimes, it is difficult to find the reason for the pain. The search for the cause and treatment can be frustrating. Treatment often takes several weeks to a few months before you notice any improvement. You may also need to avoid sexual activity until symptoms improve.Continuing to have sex when it hurts can delay healing and actually make the problem worse. The treatment depends on the cause of the pain. Treatment may include:  Medicines such as antibiotics, vaginal or skin creams, hormones, or antidepressants.  Minor or major surgery.  Psychological counseling or group therapy.  Kegel exercises and vaginal dilators to help certain cases of vaginismus (spasms). Do this only if recommended by your caregiver.Kegel exercises can make some problems worse.  Applying lubrication as recommended by your caregiver if you have dryness.  Sex therapy for you and your sex partner. It is common for  the pain to continue after the reason for the pain has been treated. Some reasons for this include a conditioned response. This means the person having the pain becomes so familiar with the pain that the pain continues as a response, even though the cause is removed. Sex therapy can help with this problem. HOME CARE INSTRUCTIONS   Follow your caregiver's instructions about taking medicines, tests, counseling, and follow-up treatment.  Do not use scented tampons, douches, vaginal sprays, or soaps.  Use water-based lubricants for dryness. Oil lubricants can cause irritation.  Do not use spermicides or condoms that irritate you.  Openly discuss with your partner your sexual experience, your desires, foreplay, and different sexual positions for a more comfortable and enjoyable sexual relationship.  Join group sessions for therapy, if needed.  Practice safe sex at all times.  Empty your bladder before having intercourse.  Try different positions during sexual intercourse.  Take over-the-counter pain medicine recommended by your caregiver before having sexual intercourse.  Do not wear pantyhose. Knee-high and thigh-high hose are okay.  Avoid scrubbing your vulva with a washcloth. Wash the area gently and pat dry with a towel. SEEK MEDICAL CARE IF:   You develop vaginal bleeding after sexual intercourse.  You develop a lump at the opening of your vagina, even if it is not painful.  You have abnormal vaginal discharge.  You have vaginal dryness.  You have itching or irritation of the vulva or vagina.  You   develop a rash or reaction to your medicine. SEEK IMMEDIATE MEDICAL CARE IF:   You develop severe abdominal pain during or shortly after sexual intercourse. You could have a ruptured ovarian cyst or ruptured tubal pregnancy.  You have a fever.  You have painful or bloody urination.  You have painful sexual intercourse, and you never had it before.  You pass out after having  sexual intercourse. Document Released: 10/27/2007 Document Revised: 12/30/2011 Document Reviewed: 01/07/2011 ExitCare Patient Information 2013 [employer].  

## 2013-03-23 ENCOUNTER — Encounter: Payer: Self-pay | Admitting: Gastroenterology

## 2013-03-24 ENCOUNTER — Telehealth: Payer: Self-pay | Admitting: *Deleted

## 2013-03-24 MED ORDER — LUBIPROSTONE 24 MCG PO CAPS: 24.0000 ug | ORAL_CAPSULE | Freq: Two times a day (BID) | ORAL | Status: DC

## 2013-03-24 MED ORDER — LINACLOTIDE 290 MCG PO CAPS: 290.0000 ug | ORAL_CAPSULE | Freq: Every day | ORAL | Status: DC | PRN

## 2013-03-24 NOTE — Telephone Encounter (Signed)
Added on Linzess and ordered.

## 2013-03-24 NOTE — Telephone Encounter (Signed)
Per fax from [facility] patient Linzess is not covered. Patient has to try Amitiza for a month and fail it before it will be covered. No record in patients chart of her trying Amitiza.. Left message for patient to call office back. Sent Amitiza to the pharmacy.

## 2013-04-11 ENCOUNTER — Encounter: Payer: Self-pay | Admitting: Nurse Practitioner

## 2013-04-12 MED ORDER — CITALOPRAM HYDROBROMIDE 40 MG PO TABS: 40.0000 mg | ORAL_TABLET | Freq: Every day | ORAL | Status: DC

## 2013-06-09 DIAGNOSIS — R55 Syncope and collapse: Secondary | ICD-10-CM

## 2013-06-10 ENCOUNTER — Encounter: Payer: Self-pay | Admitting: Physician Assistant

## 2013-06-10 DIAGNOSIS — R55 Syncope and collapse: Secondary | ICD-10-CM

## 2013-06-22 ENCOUNTER — Ambulatory Visit (INDEPENDENT_AMBULATORY_CARE_PROVIDER_SITE_OTHER): Payer: Medicaid Other | Admitting: Internal Medicine

## 2013-06-22 ENCOUNTER — Encounter: Payer: Self-pay | Admitting: Internal Medicine

## 2013-06-22 VITALS — BP 114/72 | HR 92 | Ht 64.0 in | Wt 183.6 lb

## 2013-06-22 DIAGNOSIS — R55 Syncope and collapse: Secondary | ICD-10-CM

## 2013-06-22 MED ORDER — FLUDROCORTISONE ACETATE 0.1 MG PO TABS: 0.1000 mg | ORAL_TABLET | Freq: Two times a day (BID) | ORAL | Status: DC

## 2013-06-22 NOTE — Progress Notes (Signed)
HPI Krystal Mckee is referred today for recurrent syncope. The patient is a very pleasant 37 year old woman with history of syncope dating back many years. The episodes have waxed and waned. When I saw the patient last several months ago, we recommended increasing her salt and fluid intake. She has done this fairly well, at times being pretty good about increasing her fluid intake and at other times not so much. The patient's episodes of syncope have almost all occurred while standing. When she awakens, she will feel weak and fatigued and often diaphoretic. Allergies  Allergen Reactions  . Penicillins      Current Outpatient Prescriptions  Medication Sig Dispense Refill  . busPIRone (BUSPAR) 15 MG tablet Take 15 mg by mouth daily.      . citalopram (CELEXA) 40 MG tablet Take 40 mg by mouth daily.      . cyclobenzaprine (FLEXERIL) 5 MG tablet Take 1 tablet (5 mg total) by mouth 2 (two) times daily as needed for muscle spasms.  60 tablet  1  . levothyroxine (SYNTHROID) 25 MCG tablet Take 25 mcg by mouth daily.      Marland Kitchen lubiprostone (AMITIZA) 24 MCG capsule Take 1 capsule (24 mcg total) by mouth 2 (two) times daily with a meal.  60 capsule  1  . fludrocortisone (FLORINEF) 0.1 MG tablet Take 1 tablet (0.1 mg total) by mouth 2 (two) times daily.  60 tablet  11   No current facility-administered medications for this visit.     Past Medical History  Diagnosis Date  . Depression   . Anxiety   . Hypothyroidism   . Colon polyps     ROS:   All systems reviewed and negative except as noted in the HPI.   Past Surgical History  Procedure Laterality Date  . Partial hysterectomy    . Tonsillectomy and adenoidectomy    . Cyst excision      Right wrist  . Cyst excision      Left knee  . Cesarean section    . Colonoscopy  01/18/13     Family History  Problem Relation Age of Onset  . Colon polyps Father   . Ovarian cancer Mother   . Diabetes Maternal Grandmother      History    Social History  . Marital Status: Unknown    Spouse Name: N/A    Number of Children: 3  . Years of Education: N/A   Occupational History  .     Social History Main Topics  . Smoking status: Current Every Day Smoker -- 0.25 packs/day  . Smokeless tobacco: Never Used  . Alcohol Use: No  . Drug Use: No  . Sexual Activity: Yes    Birth Control/ Protection: Other-see comments     Comment: TVH 2001   Other Topics Concern  . Not on file   Social History Narrative  . No narrative on file     BP 114/72  Pulse 92  Ht 5\' 4"  (1.626 m)  Wt 183 lb 9.6 oz (83.28 kg)  BMI 31.5 kg/m2  Physical Exam:  Well appearing Krystal Mckee 12-year-old woman, NAD HEENT: Unremarkable Neck:  6 cm JVD, no thyromegally Back:  No CVA tenderness Lungs:  Clear with no wheezes, rales, or rhonchi. HEART:  Regular rate rhythm, no murmurs, no rubs, no clicks Abd:  soft, positive bowel sounds, no organomegally, no rebound, no guarding Ext:  2 plus pulses, no edema, no cyanosis, no clubbing Skin:  No rashes no nodules Neuro:  CN II through XII intact, motor grossly intact  Assess/Plan:

## 2013-06-22 NOTE — Patient Instructions (Signed)
Your physician recommends that you schedule a follow-up appointment in: 3 months with Dr Ladona Ridgel   Your physician has recommended you make the following change in your medication:  1) Start Florinef 0.1mg  twice daily at 7am and 1 pm

## 2013-06-22 NOTE — Assessment & Plan Note (Signed)
I still suspect the episode of syncope is neurally mediated, and that she has autonomic dysfunction. I discussed the treatment options in detail with the patient and her husband. One option would be to proceed with insertion of an implantable loop recorder. A second option would be initiation of Florinef. After discussing the pros and cons of both approaches, she will start Florinef, and I'll see her back in several months. I do not expect Florinef to cure of her syncope, but hopefully will help. I know that she is also pending neurologic evaluation, though I suspect her symptoms are not caused by seizures.

## 2013-06-29 ENCOUNTER — Ambulatory Visit: Payer: Medicaid Other | Admitting: Internal Medicine

## 2013-06-30 ENCOUNTER — Ambulatory Visit (INDEPENDENT_AMBULATORY_CARE_PROVIDER_SITE_OTHER): Payer: Medicaid Other | Admitting: Neurology

## 2013-06-30 ENCOUNTER — Encounter: Payer: Self-pay | Admitting: Neurology

## 2013-06-30 VITALS — BP 111/79 | HR 60 | Ht 64.0 in | Wt 187.0 lb

## 2013-06-30 DIAGNOSIS — R4182 Altered mental status, unspecified: Secondary | ICD-10-CM
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
## 2013-06-30 NOTE — Patient Instructions (Addendum)
Overall you are doing fairly well but I do want to suggest a few things today:   Follow up with your primary care physician for further evaluation of your thyroid levels  As far as diagnostic testing: I would like to get an EEG of your brain  I would like to see you back once the EEG is complete, sooner if we need to. Please call us with any interim questions, concerns, problems, updates or refill requests.   Please also call us for any test results so we can go over those with you on the phone.  My clinical assistant and will answer any of your questions and relay your messages to me and also relay most of my messages to you.   Our phone number is [phone]. We also have an after hours call service for urgent matters and there is a physician on-call for urgent questions. For any emergencies you know to call 911 or go to the nearest emergency room

## 2013-07-02 ENCOUNTER — Ambulatory Visit (INDEPENDENT_AMBULATORY_CARE_PROVIDER_SITE_OTHER): Payer: Medicaid Other | Admitting: General Practice

## 2013-07-02 ENCOUNTER — Encounter: Payer: Self-pay | Admitting: General Practice

## 2013-07-02 ENCOUNTER — Ambulatory Visit (INDEPENDENT_AMBULATORY_CARE_PROVIDER_SITE_OTHER): Payer: Medicaid Other

## 2013-07-02 VITALS — BP 102/66 | HR 74 | Temp 97.3°F | Ht 64.0 in | Wt 187.0 lb

## 2013-07-02 DIAGNOSIS — R631 Polydipsia: Secondary | ICD-10-CM

## 2013-07-02 DIAGNOSIS — R4182 Altered mental status, unspecified: Secondary | ICD-10-CM

## 2013-07-02 DIAGNOSIS — Z833 Family history of diabetes mellitus: Secondary | ICD-10-CM

## 2013-07-02 DIAGNOSIS — R7989 Other specified abnormal findings of blood chemistry: Secondary | ICD-10-CM

## 2013-07-02 DIAGNOSIS — R350 Frequency of micturition: Secondary | ICD-10-CM

## 2013-07-02 DIAGNOSIS — R632 Polyphagia: Secondary | ICD-10-CM

## 2013-07-02 DIAGNOSIS — R6889 Other general symptoms and signs: Secondary | ICD-10-CM

## 2013-07-02 DIAGNOSIS — E039 Hypothyroidism, unspecified: Secondary | ICD-10-CM

## 2013-07-02 MED ORDER — LEVOTHYROXINE SODIUM 75 MCG PO TABS: 75.0000 ug | ORAL_TABLET | Freq: Every day | ORAL | Status: DC

## 2013-07-02 NOTE — Procedures (Signed)
  History:  Krystal Mckee is a 37 year old patient with a history of episodes of loss of consciousness. The patient will get lightheaded and dizzy prior to the syncopal events. The patient had 3 episodes in one day 2 weeks prior to the study. The patient may have stiffening, and some brief twitching during the events. The patient may be confused 30 minutes following the episode. The patient is being evaluated for possible seizures.  This is a routine EEG. No skull defects are noted. Medications include BuSpar, Celexa, Florinef, Synthroid, and Amitiza.  EEG classification: Normal awake  Description of the recording: The background rhythms of this recording consists of a fairly well modulated medium amplitude alpha rhythm of 10 Hz that is reactive to eye opening and closure. As the record progresses, the patient appears to remain in the waking state throughout the recording. Photic stimulation was performed, resulting in a bilateral and symmetric photic driving response. Hyperventilation was also performed, resulting in a minimal buildup of the background rhythm activities without significant slowing seen. At no time during the recording does there appear to be evidence of spike or spike wave discharges or evidence of focal slowing. EKG monitor shows no evidence of cardiac rhythm abnormalities with a heart rate of 56.  Impression: This is a normal EEG recording in the waking state. No evidence of ictal or interictal discharges are seen.

## 2013-07-02 NOTE — Patient Instructions (Addendum)

## 2013-07-02 NOTE — Progress Notes (Signed)
  Subjective:    Patient ID: Krystal Mckee, female    DOB: July 13, 1976, 37 y.o.   MRN: [id number]  Thyroid Problem Presents for follow-up visit. Symptoms include anxiety, constipation, depressed mood, fatigue, hair loss, palpitations, tremors, visual change and weight gain. Patient reports no diarrhea or dry skin. The symptoms have been worsening. Past treatments include levothyroxine. The treatment provided no relief. There is no history of hyperlipidemia. (Syncope) There are no known risk factors.   Reports being seen at [facility] and TSH level was 18.54, she is currently taking 37.81mcg of levothyroxine. Reports dosage has been same for past year.    Review of Systems  Constitutional: Positive for weight gain and fatigue. Negative for chills.  HENT: Negative for neck pain and neck stiffness.   Respiratory: Negative for chest tightness and shortness of breath.   Cardiovascular: Positive for palpitations. Negative for chest pain.  Gastrointestinal: Positive for constipation. Negative for diarrhea.  Genitourinary: Negative for dysuria and difficulty urinating.  Neurological: Positive for dizziness and tremors. Negative for weakness and headaches.       Objective:   Physical Exam  Constitutional: She is oriented to person, place, and time. She appears well-developed and well-nourished.  HENT:  Head: Normocephalic and atraumatic.  Right Ear: External ear normal.  Left Ear: External ear normal.  Nose: Nose normal.  Mouth/Throat: Oropharynx is clear and moist.  Eyes: EOM are normal. Pupils are equal, round, and reactive to light.  Neck: Normal range of motion. Neck supple. No thyromegaly present.  Cardiovascular: Normal rate, regular rhythm and normal heart sounds.   Pulmonary/Chest: Effort normal and breath sounds normal. No respiratory distress. She exhibits no tenderness.  Abdominal: Soft. Bowel sounds are normal. She exhibits no distension. There is no tenderness.   Musculoskeletal: She exhibits no edema and no tenderness.  Lymphadenopathy:    She has no cervical adenopathy.  Neurological: She is alert and oriented to person, place, and time.  Skin: Skin is warm and dry.  Psychiatric: She has a normal mood and affect.          Assessment & Plan:  1. Abnormal TSH - Thyroid Panel With TSH  2. Increased thirst, 3. Increased frequency of urination, 4. Polyphagia, 5. Family history of diabetes mellitus - POCT glycosylated hemoglobin (Hb A1C)  6. Unspecified hypothyroidism - levothyroxine (SYNTHROID) 75 MCG tablet; Take 1 tablet (75 mcg total) by mouth daily.  Dispense: 30 tablet; Refill: 3 -safety precautions discussed  -RTO in 6 weeks for tsh recheck, medication adjustment and prn -maintain appointments scheduled with neurologist and cardiologist -seek emergency medical treatment if symptoms worsen -Patient verbalized understanding -Coralie Keens, FNP-C

## 2013-07-03 LAB — THYROID PANEL WITH TSH: T3 Uptake Ratio: 27 % (ref 24–39)

## 2013-07-04 ENCOUNTER — Other Ambulatory Visit: Payer: Self-pay | Admitting: General Practice

## 2013-07-04 DIAGNOSIS — R7989 Other specified abnormal findings of blood chemistry: Secondary | ICD-10-CM

## 2013-07-04 DIAGNOSIS — E039 Hypothyroidism, unspecified: Secondary | ICD-10-CM

## 2013-07-04 MED ORDER — LEVOTHYROXINE SODIUM 88 MCG PO TABS: 88.0000 ug | ORAL_TABLET | Freq: Every day | ORAL | Status: DC

## 2013-07-05 ENCOUNTER — Telehealth: Payer: Self-pay | Admitting: Nurse Practitioner

## 2013-07-05 NOTE — Progress Notes (Signed)
Quick Note:  I called pt and relayed that her EEG was normal. She verbalized understanding. ______

## 2013-07-06 NOTE — Telephone Encounter (Signed)
Patient aware of labs.  

## 2013-08-13 ENCOUNTER — Other Ambulatory Visit (INDEPENDENT_AMBULATORY_CARE_PROVIDER_SITE_OTHER): Payer: Medicaid Other

## 2013-08-13 DIAGNOSIS — R7989 Other specified abnormal findings of blood chemistry: Secondary | ICD-10-CM

## 2013-08-13 DIAGNOSIS — R946 Abnormal results of thyroid function studies: Secondary | ICD-10-CM

## 2013-08-14 LAB — THYROID PANEL WITH TSH
T4, Total: 8.5 ug/dL (ref 4.5–12.0)
TSH: 0.847 u[IU]/mL (ref 0.450–4.500)

## 2013-08-17 ENCOUNTER — Other Ambulatory Visit: Payer: Self-pay | Admitting: Family Medicine

## 2013-08-17 ENCOUNTER — Ambulatory Visit (INDEPENDENT_AMBULATORY_CARE_PROVIDER_SITE_OTHER): Payer: Medicaid Other | Admitting: Family Medicine

## 2013-08-17 ENCOUNTER — Encounter: Payer: Self-pay | Admitting: General Practice

## 2013-08-17 ENCOUNTER — Encounter: Payer: Self-pay | Admitting: Family Medicine

## 2013-08-17 ENCOUNTER — Ambulatory Visit (INDEPENDENT_AMBULATORY_CARE_PROVIDER_SITE_OTHER): Payer: Medicaid Other

## 2013-08-17 VITALS — BP 116/77 | HR 70 | Temp 98.2°F | Ht 64.0 in | Wt 186.6 lb

## 2013-08-17 DIAGNOSIS — S92909A Unspecified fracture of unspecified foot, initial encounter for closed fracture: Secondary | ICD-10-CM

## 2013-08-17 DIAGNOSIS — M79671 Pain in right foot: Secondary | ICD-10-CM

## 2013-08-17 DIAGNOSIS — M79609 Pain in unspecified limb: Secondary | ICD-10-CM

## 2013-08-17 DIAGNOSIS — S92901A Unspecified fracture of right foot, initial encounter for closed fracture: Secondary | ICD-10-CM

## 2013-08-17 MED ORDER — NAPROXEN 500 MG PO TABS: 500.0000 mg | ORAL_TABLET | Freq: Two times a day (BID) | ORAL | Status: DC

## 2013-08-17 NOTE — Patient Instructions (Signed)

## 2013-08-17 NOTE — Progress Notes (Signed)
  Subjective:    Patient ID: Krystal Mckee, female    DOB: 1975/12/15, 37 y.o.   MRN: [id number]  HPI This 37 y.o. female presents for evaluation of right foot pain after injury. She states about 3 weeks.   Review of Systems No chest pain, SOB, HA, dizziness, vision change, N/V, diarrhea, constipation, dysuria, urinary urgency or frequency, myalgias, arthralgias or rash.     Objective:   Physical Exam Vital signs noted  Well developed well nourished female.  MS - TTP right 5th metatarsal and arch.    Xray right foot - no fracture Deatra Canter FNP     Assessment & Plan:  Right foot pain - Plan: DG Foot Complete Right Naprosyn 500mg  one po bid x 10 days #20 Follow up prn if not better.

## 2013-08-17 NOTE — Addendum Note (Signed)
Addended by: Bearl Mulberry on: 08/17/2013 03:55 PM   Modules accepted: Orders

## 2013-08-19 ENCOUNTER — Telehealth: Payer: Self-pay | Admitting: Family Medicine

## 2013-08-19 ENCOUNTER — Encounter: Payer: Self-pay | Admitting: Obstetrics & Gynecology

## 2013-08-19 ENCOUNTER — Encounter: Payer: Self-pay | Admitting: Family Medicine

## 2013-08-23 ENCOUNTER — Encounter: Payer: Self-pay | Admitting: Obstetrics & Gynecology

## 2013-08-23 ENCOUNTER — Ambulatory Visit (INDEPENDENT_AMBULATORY_CARE_PROVIDER_SITE_OTHER): Payer: Medicaid Other | Admitting: Obstetrics & Gynecology

## 2013-08-23 VITALS — BP 117/77 | HR 103 | Resp 16 | Wt 189.0 lb

## 2013-08-23 DIAGNOSIS — R350 Frequency of micturition: Secondary | ICD-10-CM

## 2013-08-23 MED ORDER — TOLTERODINE TARTRATE ER 4 MG PO CP24: 4.0000 mg | ORAL_CAPSULE | Freq: Every day | ORAL | Status: DC

## 2013-08-23 NOTE — Progress Notes (Signed)
Patient ID: Krystal Mckee, female   DOB: 05-12-1976, 37 y.o.   MRN: [id number] No LMP recorded. Patient has had a hysterectomy. She did not tolerate Flexeril because her libido was decrease despite good control of her pain. She states that she has urinary frequency, urge, and these can occur associated with dyspareunia. She is high risk for interstitial cystitis. Referral to urology is recommended. In the meantime she will try an antispasmodic, Rx for detrol 4 mg daily given. Instructions given.  RTC  Weeks   Adam Phenix, MD 08/23/2013

## 2013-08-23 NOTE — Patient Instructions (Signed)

## 2013-08-23 NOTE — Telephone Encounter (Signed)
Try tylenol and motrin otc for pain.  Every pain medicine make you itch.

## 2013-08-24 ENCOUNTER — Telehealth: Payer: Self-pay | Admitting: *Deleted

## 2013-08-24 DIAGNOSIS — R350 Frequency of micturition: Secondary | ICD-10-CM

## 2013-08-24 MED ORDER — TOLTERODINE TARTRATE ER 4 MG PO CP24: 4.0000 mg | ORAL_CAPSULE | Freq: Every day | ORAL | Status: DC

## 2013-08-24 NOTE — Telephone Encounter (Signed)
Pt sent message in MyChart to [facility] did not receive the Detrol script - I have resent the e-script and will have pt to f/u with pharm

## 2013-08-25 NOTE — Telephone Encounter (Signed)
Pt notified and verbalized understanding.

## 2013-08-26 ENCOUNTER — Other Ambulatory Visit: Payer: Self-pay

## 2013-08-29 ENCOUNTER — Other Ambulatory Visit: Payer: Self-pay | Admitting: Obstetrics & Gynecology

## 2013-09-01 ENCOUNTER — Encounter: Payer: Self-pay | Admitting: *Deleted

## 2013-09-01 ENCOUNTER — Other Ambulatory Visit: Payer: Self-pay | Admitting: Obstetrics & Gynecology

## 2013-09-01 DIAGNOSIS — R350 Frequency of micturition: Secondary | ICD-10-CM

## 2013-09-01 MED ORDER — DARIFENACIN HYDROBROMIDE ER 7.5 MG PO TB24: 7.5000 mg | ORAL_TABLET | Freq: Every day | ORAL | Status: DC

## 2013-09-02 ENCOUNTER — Encounter: Payer: Self-pay | Admitting: *Deleted

## 2013-09-02 ENCOUNTER — Other Ambulatory Visit: Payer: Self-pay | Admitting: Obstetrics & Gynecology

## 2013-09-02 DIAGNOSIS — R350 Frequency of micturition: Secondary | ICD-10-CM

## 2013-09-02 MED ORDER — FESOTERODINE FUMARATE ER 4 MG PO TB24: 4.0000 mg | ORAL_TABLET | Freq: Every day | ORAL | Status: DC

## 2013-09-09 ENCOUNTER — Other Ambulatory Visit: Payer: Self-pay | Admitting: Nurse Practitioner

## 2013-09-09 ENCOUNTER — Encounter: Payer: Self-pay | Admitting: General Practice

## 2013-09-09 ENCOUNTER — Other Ambulatory Visit: Payer: Self-pay | Admitting: General Practice

## 2013-09-09 DIAGNOSIS — E039 Hypothyroidism, unspecified: Secondary | ICD-10-CM

## 2013-09-09 MED ORDER — CITALOPRAM HYDROBROMIDE 40 MG PO TABS: 40.0000 mg | ORAL_TABLET | Freq: Every day | ORAL | Status: DC

## 2013-09-09 MED ORDER — LEVOTHYROXINE SODIUM 88 MCG PO TABS: 88.0000 ug | ORAL_TABLET | Freq: Every day | ORAL | Status: DC

## 2013-09-10 ENCOUNTER — Other Ambulatory Visit: Payer: Self-pay | Admitting: Nurse Practitioner

## 2013-09-10 MED ORDER — BUSPIRONE HCL 15 MG PO TABS: 15.0000 mg | ORAL_TABLET | Freq: Every day | ORAL | Status: DC

## 2013-09-21 ENCOUNTER — Ambulatory Visit (INDEPENDENT_AMBULATORY_CARE_PROVIDER_SITE_OTHER): Payer: Medicaid Other | Admitting: Family Medicine

## 2013-09-24 ENCOUNTER — Ambulatory Visit: Payer: Medicaid Other | Admitting: Internal Medicine

## 2013-09-27 ENCOUNTER — Encounter: Payer: Self-pay | Admitting: Internal Medicine

## 2013-09-28 ENCOUNTER — Ambulatory Visit (INDEPENDENT_AMBULATORY_CARE_PROVIDER_SITE_OTHER): Payer: Medicaid Other | Admitting: Urology

## 2013-09-28 DIAGNOSIS — IMO0002 Reserved for concepts with insufficient information to code with codable children: Secondary | ICD-10-CM

## 2013-09-28 DIAGNOSIS — N8184 Pelvic muscle wasting: Secondary | ICD-10-CM

## 2013-09-28 DIAGNOSIS — N318 Other neuromuscular dysfunction of bladder: Secondary | ICD-10-CM

## 2013-11-02 ENCOUNTER — Ambulatory Visit: Payer: Medicaid Other | Admitting: Urology

## 2013-11-30 ENCOUNTER — Telehealth: Payer: Self-pay | Admitting: Family Medicine

## 2013-11-30 ENCOUNTER — Other Ambulatory Visit: Payer: Self-pay | Admitting: Nurse Practitioner

## 2013-11-30 MED ORDER — CITALOPRAM HYDROBROMIDE 40 MG PO TABS: 40.0000 mg | ORAL_TABLET | Freq: Every day | ORAL | Status: DC

## 2013-12-01 NOTE — Telephone Encounter (Signed)
LM on vm stating [facility] has been called and given our information and number for anything needed for completion of surgery

## 2013-12-13 ENCOUNTER — Other Ambulatory Visit: Payer: Self-pay

## 2013-12-13 DIAGNOSIS — M25579 Pain in unspecified ankle and joints of unspecified foot: Secondary | ICD-10-CM

## 2014-02-02 ENCOUNTER — Other Ambulatory Visit: Payer: Self-pay | Admitting: General Practice

## 2014-02-02 DIAGNOSIS — E039 Hypothyroidism, unspecified: Secondary | ICD-10-CM

## 2014-02-02 MED ORDER — LEVOTHYROXINE SODIUM 88 MCG PO TABS: 88.0000 ug | ORAL_TABLET | Freq: Every day | ORAL | Status: DC

## 2014-02-03 ENCOUNTER — Other Ambulatory Visit: Payer: Self-pay | Admitting: Family Medicine

## 2014-02-03 DIAGNOSIS — M79671 Pain in right foot: Secondary | ICD-10-CM

## 2014-02-07 MED ORDER — NAPROXEN 500 MG PO TABS: 500.0000 mg | ORAL_TABLET | Freq: Two times a day (BID) | ORAL | Status: DC

## 2014-02-07 NOTE — Telephone Encounter (Signed)
Pt needed RX for Naproxen Okayed by Ander SladeBill Oxford Sent into pharmacy

## 2014-03-08 ENCOUNTER — Ambulatory Visit: Payer: Medicaid Other | Attending: Orthopedic Surgery | Admitting: Physical Therapy

## 2014-03-08 DIAGNOSIS — M25673 Stiffness of unspecified ankle, not elsewhere classified: Secondary | ICD-10-CM | POA: Diagnosis not present

## 2014-03-08 DIAGNOSIS — S92253A Displaced fracture of navicular [scaphoid] of unspecified foot, initial encounter for closed fracture: Secondary | ICD-10-CM | POA: Diagnosis not present

## 2014-03-08 DIAGNOSIS — M25579 Pain in unspecified ankle and joints of unspecified foot: Secondary | ICD-10-CM | POA: Insufficient documentation

## 2014-03-08 DIAGNOSIS — R5381 Other malaise: Secondary | ICD-10-CM | POA: Insufficient documentation

## 2014-03-08 DIAGNOSIS — IMO0001 Reserved for inherently not codable concepts without codable children: Secondary | ICD-10-CM | POA: Diagnosis not present

## 2014-03-08 DIAGNOSIS — X58XXXA Exposure to other specified factors, initial encounter: Secondary | ICD-10-CM | POA: Diagnosis not present

## 2014-03-08 DIAGNOSIS — M25676 Stiffness of unspecified foot, not elsewhere classified: Secondary | ICD-10-CM | POA: Insufficient documentation

## 2014-03-21 ENCOUNTER — Other Ambulatory Visit: Payer: Self-pay | Admitting: Nurse Practitioner

## 2014-03-21 MED ORDER — CITALOPRAM HYDROBROMIDE 40 MG PO TABS: 40.0000 mg | ORAL_TABLET | Freq: Every day | ORAL | Status: DC

## 2014-04-06 ENCOUNTER — Encounter: Payer: Medicaid Other | Admitting: Physical Therapy

## 2014-05-31 ENCOUNTER — Ambulatory Visit (INDEPENDENT_AMBULATORY_CARE_PROVIDER_SITE_OTHER): Payer: Medicaid Other | Admitting: Nurse Practitioner

## 2014-05-31 ENCOUNTER — Encounter: Payer: Self-pay | Admitting: Nurse Practitioner

## 2014-05-31 VITALS — BP 104/69 | HR 73 | Temp 97.5°F | Ht 64.0 in

## 2014-05-31 DIAGNOSIS — F32A Depression, unspecified: Secondary | ICD-10-CM | POA: Insufficient documentation

## 2014-05-31 DIAGNOSIS — F329 Major depressive disorder, single episode, unspecified: Secondary | ICD-10-CM

## 2014-05-31 DIAGNOSIS — F411 Generalized anxiety disorder: Secondary | ICD-10-CM | POA: Insufficient documentation

## 2014-05-31 DIAGNOSIS — E039 Hypothyroidism, unspecified: Secondary | ICD-10-CM

## 2014-05-31 DIAGNOSIS — F3289 Other specified depressive episodes: Secondary | ICD-10-CM

## 2014-05-31 MED ORDER — SERTRALINE HCL 50 MG PO TABS: 50.0000 mg | ORAL_TABLET | Freq: Every day | ORAL | Status: DC

## 2014-05-31 NOTE — Progress Notes (Signed)
   Subjective:    Patient ID: Krystal Mckee, female    DOB: [birth year], 38 y.o.   MRN: [id number]  Having more anxiety and depression symptoms and doesn't feel like current medication is working. Currently on celexa and buspar. Has been having concentration issues, more jittery and tearful for the past two months.  Needs bloodwork to recheck thyroid level.    Anxiety Presents for follow-up visit. Onset was more than 5 years ago. The problem has been gradually worsening. Symptoms include decreased concentration, depressed mood, excessive worry, insomnia, irritability, nervous/anxious behavior, obsessions and panic. Symptoms occur most days. The severity of symptoms is moderate. Nothing aggravates the symptoms. The quality of sleep is poor. Nighttime awakenings: occasional.   Risk factors include family history. Her past medical history is significant for anxiety/panic attacks and depression. Past treatments include non-SSRI antidepressants. The treatment provided mild relief. Compliance with prior treatments has been good.  Thyroid Problem Presents for follow-up (hypothyroidism) visit. Symptoms include depressed mood. The symptoms have been stable. Past treatments include levothyroxine.        Review of Systems  Constitutional: Positive for irritability.  Psychiatric/Behavioral: Positive for decreased concentration. The patient is nervous/anxious and has insomnia.        Objective:   Physical Exam  Constitutional: She is oriented to person, place, and time. She appears well-developed and well-nourished.  Cardiovascular: Normal rate and regular rhythm.   Neurological: She is alert and oriented to person, place, and time.  Skin: Skin is warm.  Psychiatric: She has a normal mood and affect. Her behavior is normal. Judgment and thought content normal.   BP 104/69  Pulse 73  Temp(Src) 97.5 F (36.4 C) (Oral)  Ht 5\' 4"  (1.626 m)        Assessment & Plan:  1. Hypothyroidism,  unspecified hypothyroidism type Labs pending - Thyroid Panel With TSH  2. Depression Stop celexa- changed to zoloft Stress management - sertraline (ZOLOFT) 50 MG tablet; Take 1 tablet (50 mg total) by mouth daily.  Dispense: 30 tablet; Refill: 3  3. GAD (generalized anxiety disorder)   Mary-Margaret Daphine DeutscherMartin, FNP

## 2014-05-31 NOTE — Patient Instructions (Signed)
Stress and Stress Management Stress is a normal reaction to life events. It is what you feel when life demands more than you are used to or more than you can handle. Some stress can be useful. For example, the stress reaction can help you catch the last bus of the day, study for a test, or meet a deadline at work. But stress that occurs too often or for too long can cause problems. It can affect your emotional health and interfere with relationships and normal daily activities. Too much stress can weaken your immune system and increase your risk for physical illness. If you already have a medical problem, stress can make it worse. CAUSES  All sorts of life events may cause stress. An event that causes stress for one person may not be stressful for another person. Major life events commonly cause stress. These may be positive or negative. Examples include losing your job, moving into a new home, getting married, having a baby, or losing a loved one. Less obvious life events may also cause stress, especially if they occur day after day or in combination. Examples include working long hours, driving in traffic, caring for children, being in debt, or being in a difficult relationship. SIGNS AND SYMPTOMS Stress may cause emotional symptoms including, the following:  Anxiety. This is feeling worried, afraid, on edge, overwhelmed, or out of control.  Anger. This is feeling irritated or impatient.  Depression. This is feeling sad, down, helpless, or guilty.  Difficulty focusing, remembering, or making decisions. Stress may cause physical symptoms, including the following:   Aches and pains. These may affect your head, neck, back, stomach, or other areas of your body.  Tight muscles or clenched jaw.  Low energy or trouble sleeping. Stress may cause unhealthy behaviors, including the following:   Eating to feel better (overeating) or skipping meals.  Sleeping too little, too much, or both.  Working  too much or putting off tasks (procrastination).  Smoking, drinking alcohol, or using drugs to feel better. DIAGNOSIS  Stress is diagnosed through an assessment by your health care provider. Your health care provider will ask questions about your symptoms and any stressful life events.Your health care provider will also ask about your medical history and may order blood tests or other tests. Certain medical conditions and medicine can cause physical symptoms similar to stress. Mental illness can cause emotional symptoms and unhealthy behaviors similar to stress. Your health care provider may refer you to a mental health professional for further evaluation.  TREATMENT  Stress management is the recommended treatment for stress.The goals of stress management are reducing stressful life events and coping with stress in healthy ways.  Techniques for reducing stressful life events include the following:  Stress identification. Self-monitor for stress and identify what causes stress for you. These skills may help you to avoid some stressful events.  Time management. Set your priorities, keep a calendar of events, and learn to say "no." These tools can help you avoid making too many commitments. Techniques for coping with stress include the following:  Rethinking the problem. Try to think realistically about stressful events rather than ignoring them or overreacting. Try to find the positives in a stressful situation rather than focusing on the negatives.  Exercise. Physical exercise can release both physical and emotional tension. The key is to find a form of exercise you enjoy and do it regularly.  Relaxation techniques. These relax the body and mind. Examples include yoga, meditation, tai chi, biofeedback, deep  breathing, progressive muscle relaxation, listening to music, being out in nature, journaling, and other hobbies. Again, the key is to find one or more that you enjoy and can do  regularly.  Healthy lifestyle. Eat a balanced diet, get plenty of sleep, and do not smoke. Avoid using alcohol or drugs to relax.  Strong support network. Spend time with family, friends, or other people you enjoy being around.Express your feelings and talk things over with someone you trust. Counseling or talktherapy with a mental health professional may be helpful if you are having difficulty managing stress on your own. Medicine is typically not recommended for the treatment of stress.Talk to your health care provider if you think you need medicine for symptoms of stress. HOME CARE INSTRUCTIONS  Keep all follow-up visits as directed by your health care provider.  Take all medicines as directed by your health care provider. SEEK MEDICAL CARE IF:  Your symptoms get worse or you start having new symptoms.  You feel overwhelmed by your problems and can no longer manage them on your own. SEEK IMMEDIATE MEDICAL CARE IF:  You feel like hurting yourself or someone else. Document Released: 04/02/2001 Document Revised: 02/21/2014 Document Reviewed: 06/01/2013 Exit[employer] Patient Information 2015 [employer]. This information is not intended to replace advice given to you by your health care provider. Make sure you discuss any questions you have with your health care provider.  

## 2014-06-01 LAB — THYROID PANEL WITH TSH
Free Thyroxine Index: 1.9 (ref 1.2–4.9)
T3 UPTAKE RATIO: 26 % (ref 24–39)
T4, Total: 7.3 ug/dL (ref 4.5–12.0)
TSH: 3.13 u[IU]/mL (ref 0.450–4.500)

## 2014-06-09 ENCOUNTER — Telehealth: Payer: Self-pay | Admitting: Nurse Practitioner

## 2014-06-09 NOTE — Telephone Encounter (Signed)
Just started on 8/11- give it anithe rweek and see if resolves

## 2014-06-09 NOTE — Telephone Encounter (Signed)
After she has started Zoloft she is having a lot headaches and diarrhea what do you think?

## 2014-06-10 NOTE — Telephone Encounter (Signed)
Patient notified. She will call back if symptoms worsen or do not resolve in a week.

## 2014-06-20 ENCOUNTER — Ambulatory Visit (INDEPENDENT_AMBULATORY_CARE_PROVIDER_SITE_OTHER): Payer: Medicaid Other | Admitting: Family Medicine

## 2014-06-20 VITALS — BP 107/66 | HR 88 | Temp 99.6°F | Ht 64.0 in | Wt 193.0 lb

## 2014-06-20 DIAGNOSIS — R21 Rash and other nonspecific skin eruption: Secondary | ICD-10-CM

## 2014-06-20 DIAGNOSIS — F411 Generalized anxiety disorder: Secondary | ICD-10-CM

## 2014-06-20 MED ORDER — PAROXETINE HCL 20 MG PO TABS: 20.0000 mg | ORAL_TABLET | Freq: Every day | ORAL | Status: DC

## 2014-06-20 MED ORDER — METHYLPREDNISOLONE ACETATE 80 MG/ML IJ SUSP
80.0000 mg | Freq: Once | INTRAMUSCULAR | Status: AC
  Administered 2014-06-20: 80 mg via INTRAMUSCULAR

## 2014-06-20 MED ORDER — HYDROXYZINE HCL 25 MG PO TABS: ORAL_TABLET | ORAL | Status: DC

## 2014-06-20 NOTE — Progress Notes (Signed)
   Subjective:    Patient ID: Krystal Mckee, female    DOB: [birth year], 38 y.o.   MRN: [id number]  HPI  This 38 y.o. female presents for evaluation of rash and she feels this is an allergy to zoloft.  She  Has been having raised rash on her neck for a day.  She has been having problems with panic and anxiety.  Review of Systems C/o rash No chest pain, SOB, HA, dizziness, vision change, N/V, diarrhea, constipation, dysuria, urinary urgency or frequency, myalgias, arthralgias.     Objective:   Physical Exam   Vital signs noted  Well developed well nourished female.  HEENT - Head atraumatic Normocephalic                Eyes - PERRLA, Conjuctiva - clear Sclera- Clear EOMI                Ears - EAC's Wnl TM's Wnl Gross Hearing WNL                Throat - oropharanx wnl Respiratory - Lungs CTA bilateral Cardiac - RRR S1 and S2 without murmur GI - Abdomen soft Nontender and bowel sounds active x 4 Skin - Raised erythematous rash on neck     Assessment & Plan:  Rash and nonspecific skin eruption - Plan: hydrOXYzine (ATARAX/VISTARIL) 25 MG tablet, methylPREDNISolone acetate (DEPO-MEDROL) injection 80 mg  GAD (generalized anxiety disorder) - Plan: PARoxetine (PAXIL) 20 MG tablet Atarax  po tid prn anxiety.    Deatra Canter FNP

## 2014-07-04 ENCOUNTER — Ambulatory Visit: Payer: Medicaid Other | Admitting: Nurse Practitioner

## 2014-08-22 ENCOUNTER — Encounter: Payer: Self-pay | Admitting: Nurse Practitioner

## 2014-10-12 ENCOUNTER — Telehealth: Payer: Self-pay | Admitting: Nurse Practitioner

## 2014-10-13 NOTE — Telephone Encounter (Signed)
mucinex OTC

## 2014-11-20 ENCOUNTER — Other Ambulatory Visit: Payer: Self-pay | Admitting: General Practice

## 2015-01-23 ENCOUNTER — Ambulatory Visit: Payer: Medicaid Other | Admitting: Obstetrics & Gynecology

## 2015-01-25 ENCOUNTER — Other Ambulatory Visit: Payer: Self-pay | Admitting: Family Medicine

## 2015-02-06 ENCOUNTER — Encounter: Payer: Self-pay | Admitting: Family

## 2015-02-06 ENCOUNTER — Ambulatory Visit (INDEPENDENT_AMBULATORY_CARE_PROVIDER_SITE_OTHER): Payer: BC Managed Care – PPO | Admitting: Family

## 2015-02-06 VITALS — BP 112/74 | HR 98 | Temp 97.3°F | Ht 64.0 in | Wt 192.0 lb

## 2015-02-06 DIAGNOSIS — R5383 Other fatigue: Secondary | ICD-10-CM | POA: Diagnosis not present

## 2015-02-06 DIAGNOSIS — M545 Low back pain: Secondary | ICD-10-CM | POA: Diagnosis not present

## 2015-02-06 MED ORDER — MELOXICAM 15 MG PO TABS: 15.0000 mg | ORAL_TABLET | Freq: Every day | ORAL | Status: DC

## 2015-02-06 MED ORDER — CYCLOBENZAPRINE HCL 10 MG PO TABS: 10.0000 mg | ORAL_TABLET | Freq: Three times a day (TID) | ORAL | Status: DC | PRN

## 2015-02-06 MED ORDER — PAROXETINE HCL 40 MG PO TABS: 40.0000 mg | ORAL_TABLET | ORAL | Status: DC

## 2015-02-06 MED ORDER — KETOROLAC TROMETHAMINE 60 MG/2ML IM SOLN
60.0000 mg | Freq: Once | INTRAMUSCULAR | Status: AC
  Administered 2015-02-06: 60 mg via INTRAMUSCULAR

## 2015-02-06 NOTE — Progress Notes (Signed)
   Subjective:    Patient ID: Krystal Mckee, female    DOB: [birth year], 39 y.o.   MRN: [id number]  Back Pain This is a new problem. The current episode started 1 to 4 weeks ago. The problem occurs constantly. The problem has been gradually worsening since onset. The pain is present in the gluteal. The quality of the pain is described as aching and shooting. The pain is at a severity of 9/10. The pain is moderate. The pain is worse during the night. The symptoms are aggravated by position and sitting. Pertinent negatives include no bladder incontinence, bowel incontinence, dysuria, headaches, leg pain, numbness, tingling or weakness. Treatments tried: Tylenol. The treatment provided mild relief.      Review of Systems  Constitutional: Negative.   HENT: Negative.   Eyes: Negative.   Respiratory: Negative.  Negative for shortness of breath.   Cardiovascular: Negative.  Negative for palpitations.  Gastrointestinal: Negative.  Negative for bowel incontinence.  Endocrine: Negative.   Genitourinary: Negative.  Negative for bladder incontinence and dysuria.  Musculoskeletal: Positive for back pain.  Neurological: Negative.  Negative for tingling, weakness, numbness and headaches.  Hematological: Negative.   Psychiatric/Behavioral: Negative.   All other systems reviewed and are negative.      Objective:   Physical Exam  Constitutional: She is oriented to person, place, and time. She appears well-developed and well-nourished. No distress.  HENT:  Head: Normocephalic and atraumatic.  Mouth/Throat: Oropharynx is clear and moist.  Eyes: Pupils are equal, round, and reactive to light.  Neck: Normal range of motion. Neck supple. No thyromegaly present.  Cardiovascular: Normal rate, regular rhythm, normal heart sounds and intact distal pulses.   No murmur heard. Pulmonary/Chest: Effort normal and breath sounds normal. No respiratory distress. She has no wheezes.  Abdominal: Soft. Bowel  sounds are normal. She exhibits no distension. There is no tenderness.  Musculoskeletal: Normal range of motion. She exhibits no edema or tenderness.  Neurological: She is alert and oriented to person, place, and time. She has normal reflexes. No cranial nerve deficit.  Skin: Skin is warm and dry.  Psychiatric: She has a normal mood and affect. Her behavior is normal. Judgment and thought content normal.  Vitals reviewed.   BP 112/74 mmHg  Pulse 98  Temp(Src) 97.3 F (36.3 C) (Oral)  Ht $R'5\' 4"'KJ$  (1.626 m)  Wt 192 lb (87.091 kg)  BMI 32.94 kg/m2       Assessment & Plan:  1. Midline low back pain, with sciatica presence unspecified -Rest -No NSAID's and make sure to take Mobic with food -Sedation precaution discussed with Flexeril - ketorolac (TORADOL) injection 60 mg; Inject 2 mLs (60 mg total) into the muscle once. - meloxicam (MOBIC) 15 MG tablet; Take 1 tablet (15 mg total) by mouth daily.  Dispense: 30 tablet; Refill: 3 - cyclobenzaprine (FLEXERIL) 10 MG tablet; Take 1 tablet (10 mg total) by mouth 3 (three) times daily as needed for muscle spasms.  Dispense: 60 tablet; Refill: 3 - BMP8+EGFR  2. Other fatigue -Pt to make appointment to be seen for chronic follow up -Paxil increased to 40 mg daily - Anemia Profile B - CMP14+EGFR - Thyroid Panel With TSH - Vit D  25 hydroxy (rtn osteoporosis monitoring)   Evelina Dun, FNP

## 2015-02-06 NOTE — Patient Instructions (Addendum)
Back Pain, Adult Low back pain is very common. About 1 in 5 people have back pain.The cause of low back pain is rarely dangerous. The pain often gets better over time.About half of people with a sudden onset of back pain feel better in just 2 weeks. About 8 in 10 people feel better by 6 weeks.  CAUSES Some common causes of back pain include:  Strain of the muscles or ligaments supporting the spine.  Wear and tear (degeneration) of the spinal discs.  Arthritis.  Direct injury to the back. DIAGNOSIS Most of the time, the direct cause of low back pain is not known.However, back pain can be treated effectively even when the exact cause of the pain is unknown.Answering your caregiver's questions about your overall health and symptoms is one of the most accurate ways to make sure the cause of your pain is not dangerous. If your caregiver needs more information, he or she may order lab work or imaging tests (X-rays or MRIs).However, even if imaging tests show changes in your back, this usually does not require surgery. HOME CARE INSTRUCTIONS For many people, back pain returns.Since low back pain is rarely dangerous, it is often a condition that people can learn to manageon their own.   Remain active. It is stressful on the back to sit or stand in one place. Do not sit, drive, or stand in one place for more than 30 minutes at a time. Take short walks on level surfaces as soon as pain allows.Try to increase the length of time you walk each day.  Do not stay in bed.Resting more than 1 or 2 days can delay your recovery.  Do not avoid exercise or work.Your body is made to move.It is not dangerous to be active, even though your back may hurt.Your back will likely heal faster if you return to being active before your pain is gone.  Pay attention to your body when you bend and lift. Many people have less discomfortwhen lifting if they bend their knees, keep the load close to their bodies,and  avoid twisting. Often, the most comfortable positions are those that put less stress on your recovering back.  Find a comfortable position to sleep. Use a firm mattress and lie on your side with your knees slightly bent. If you lie on your back, put a pillow under your knees.  Only take over-the-counter or prescription medicines as directed by your caregiver. Over-the-counter medicines to reduce pain and inflammation are often the most helpful.Your caregiver may prescribe muscle relaxant drugs.These medicines help dull your pain so you can more quickly return to your normal activities and healthy exercise.  Put ice on the injured area.  Put ice in a plastic bag.  Place a towel between your skin and the bag.  Leave the ice on for 15-20 minutes, 03-04 times a day for the first 2 to 3 days. After that, ice and heat may be alternated to reduce pain and spasms.  Ask your caregiver about trying back exercises and gentle massage. This may be of some benefit.  Avoid feeling anxious or stressed.Stress increases muscle tension and can worsen back pain.It is important to recognize when you are anxious or stressed and learn ways to manage it.Exercise is a great option. SEEK MEDICAL CARE IF:  You have pain that is not relieved with rest or medicine.  You have pain that does not improve in 1 week.  You have new symptoms.  You are generally not feeling well. SEEK   IMMEDIATE MEDICAL CARE IF:   You have pain that radiates from your back into your legs.  You develop new bowel or bladder control problems.  You have unusual weakness or numbness in your arms or legs.  You develop nausea or vomiting.  You develop abdominal pain.  You feel faint. Document Released: 10/07/2005 Document Revised: 04/07/2012 Document Reviewed: 02/08/2014 [facility] Patient Information 2015 [place], [place]. This information is not intended to replace advice given to you by your health care provider. Make sure you  discuss any questions you have with your health care provider. Fatigue Fatigue is a feeling of tiredness, lack of energy, lack of motivation, or feeling tired all the time. Having enough rest, good nutrition, and reducing stress will normally reduce fatigue. Consult your caregiver if it persists. The nature of your fatigue will help your caregiver to find out its cause. The treatment is based on the cause.  CAUSES  There are many causes for fatigue. Most of the time, fatigue can be traced to one or more of your habits or routines. Most causes fit into one or more of three general areas. They are: Lifestyle problems  Sleep disturbances.  Overwork.  Physical exertion.  Unhealthy habits.  Poor eating habits or eating disorders.  Alcohol and/or drug use .  Lack of proper nutrition (malnutrition). Psychological problems  Stress and/or anxiety problems.  Depression.  Grief.  Boredom. Medical Problems or Conditions  Anemia.  Pregnancy.  Thyroid gland problems.  Recovery from major surgery.  Continuous pain.  Emphysema or asthma that is not well controlled  Allergic conditions.  Diabetes.  Infections (such as mononucleosis).  Obesity.  Sleep disorders, such as sleep apnea.  Heart failure or other heart-related problems.  Cancer.  Kidney disease.  Liver disease.  Effects of certain medicines such as antihistamines, cough and cold remedies, prescription pain medicines, heart and blood pressure medicines, drugs used for treatment of cancer, and some antidepressants. SYMPTOMS  The symptoms of fatigue include:   Lack of energy.  Lack of drive (motivation).  Drowsiness.  Feeling of indifference to the surroundings. DIAGNOSIS  The details of how you feel help guide your caregiver in finding out what is causing the fatigue. You will be asked about your present and past health condition. It is important to review all medicines that you take, including  prescription and non-prescription items. A thorough exam will be done. You will be questioned about your feelings, habits, and normal lifestyle. Your caregiver may suggest blood tests, urine tests, or other tests to look for common medical causes of fatigue.  TREATMENT  Fatigue is treated by correcting the underlying cause. For example, if you have continuous pain or depression, treating these causes will improve how you feel. Similarly, adjusting the dose of certain medicines will help in reducing fatigue.  HOME CARE INSTRUCTIONS   Try to get the required amount of good sleep every night.  Eat a healthy and nutritious diet, and drink enough water throughout the day.  Practice ways of relaxing (including yoga or meditation).  Exercise regularly.  Make plans to change situations that cause stress. Act on those plans so that stresses decrease over time. Keep your work and personal routine reasonable.  Avoid street drugs and minimize use of alcohol.  Start taking a daily multivitamin after consulting your caregiver. SEEK MEDICAL CARE IF:   You have persistent tiredness, which cannot be accounted for.  You have fever.  You have unintentional weight loss.  You have headaches.  You  have disturbed sleep throughout the night.  You are feeling sad.  You have constipation.  You have dry skin.  You have gained weight.  You are taking any new or different medicines that you suspect are causing fatigue.  You are unable to sleep at night.  You develop any unusual swelling of your legs or other parts of your body. SEEK IMMEDIATE MEDICAL CARE IF:   You are feeling confused.  Your vision is blurred.  You feel faint or pass out.  You develop severe headache.  You develop severe abdominal, pelvic, or back pain.  You develop chest pain, shortness of breath, or an irregular or fast heartbeat.  You are unable to pass a normal amount of urine.  You develop abnormal bleeding such  as bleeding from the rectum or you vomit blood.  You have thoughts about harming yourself or committing suicide.  You are worried that you might harm someone else. MAKE SURE YOU:   Understand these instructions.  Will watch your condition.  Will get help right away if you are not doing well or get worse. Document Released: 08/04/2007 Document Revised: 12/30/2011 Document Reviewed: 02/08/2014 [facility] Patient Information 2015 [place]. This information is not intended to replace advice given to you by your health care provider. Make sure you discuss any questions you have with your health care provider.

## 2015-02-07 ENCOUNTER — Telehealth: Payer: Self-pay | Admitting: *Deleted

## 2015-02-07 ENCOUNTER — Other Ambulatory Visit: Payer: Self-pay | Admitting: Family

## 2015-02-07 DIAGNOSIS — E559 Vitamin D deficiency, unspecified: Secondary | ICD-10-CM | POA: Insufficient documentation

## 2015-02-07 LAB — ANEMIA PROFILE B
BASOS: 0 %
Basophils Absolute: 0 10*3/uL (ref 0.0–0.2)
Eos: 2 %
Eosinophils Absolute: 0.1 10*3/uL (ref 0.0–0.4)
Ferritin: 164 ng/mL — ABNORMAL HIGH (ref 15–150)
Folate: 15 ng/mL (ref >3.0)
HCT: 40.4 % (ref 34.0–46.6)
Hemoglobin: 13.9 g/dL (ref 11.1–15.9)
Immature Grans (Abs): 0 10*3/uL (ref 0.0–0.1)
Immature Granulocytes: 0 %
Iron Saturation: 23 % (ref 15–55)
Iron: 70 ug/dL (ref 27–159)
LYMPHS: 25 %
Lymphocytes Absolute: 2 10*3/uL (ref 0.7–3.1)
MCH: 31 pg (ref 26.6–33.0)
MCHC: 34.4 g/dL (ref 31.5–35.7)
MCV: 90 fL (ref 79–97)
MONOS ABS: 0.6 10*3/uL (ref 0.1–0.9)
Monocytes: 7 %
Neutrophils Absolute: 5.2 10*3/uL (ref 1.4–7.0)
Neutrophils Relative %: 66 %
Platelets: 194 10*3/uL (ref 150–379)
RBC: 4.49 x10E6/uL (ref 3.77–5.28)
RDW: 13.5 % (ref 12.3–15.4)
RETIC CT PCT: 1.2 % (ref 0.6–2.6)
Total Iron Binding Capacity: 307 ug/dL (ref 250–450)
UIBC: 237 ug/dL (ref 131–425)
VITAMIN B 12: 218 pg/mL (ref 211–946)
WBC: 7.9 10*3/uL (ref 3.4–10.8)

## 2015-02-07 LAB — CMP14+EGFR
A/G RATIO: 2 (ref 1.1–2.5)
ALBUMIN: 4.7 g/dL (ref 3.5–5.5)
ALT: 23 IU/L (ref 0–32)
AST: 17 IU/L (ref 0–40)
Alkaline Phosphatase: 77 IU/L (ref 39–117)
BUN/Creatinine Ratio: 16 (ref 8–20)
BUN: 13 mg/dL (ref 6–20)
Bilirubin Total: 1.1 mg/dL (ref 0.0–1.2)
CALCIUM: 9 mg/dL (ref 8.7–10.2)
CO2: 20 mmol/L (ref 18–29)
CREATININE: 0.83 mg/dL (ref 0.57–1.00)
Chloride: 100 mmol/L (ref 97–108)
GFR calc Af Amer: 103 mL/min/{1.73_m2} (ref >59)
GFR calc non Af Amer: 89 mL/min/{1.73_m2} (ref >59)
GLOBULIN, TOTAL: 2.4 g/dL (ref 1.5–4.5)
GLUCOSE: 90 mg/dL (ref 65–99)
Potassium: 4.3 mmol/L (ref 3.5–5.2)
Sodium: 136 mmol/L (ref 134–144)
Total Protein: 7.1 g/dL (ref 6.0–8.5)

## 2015-02-07 LAB — THYROID PANEL WITH TSH
FREE THYROXINE INDEX: 2.4 (ref 1.2–4.9)
T3 Uptake Ratio: 27 % (ref 24–39)
T4, Total: 8.8 ug/dL (ref 4.5–12.0)
TSH: 2.66 u[IU]/mL (ref 0.450–4.500)

## 2015-02-07 LAB — VITAMIN D 25 HYDROXY (VIT D DEFICIENCY, FRACTURES): Vit D, 25-Hydroxy: 10.2 ng/mL — ABNORMAL LOW (ref 30.0–100.0)

## 2015-02-07 MED ORDER — VITAMIN D (ERGOCALCIFEROL) 1.25 MG (50000 UNIT) PO CAPS: 50000.0000 [IU] | ORAL_CAPSULE | ORAL | Status: DC

## 2015-02-07 NOTE — Telephone Encounter (Signed)
-----   Message from Junie Spencerhristy A Hawks, FNP sent at 02/07/2015  2:20 PM EDT ----- Kidney and liver function stable Anemia Profile (Iron levels, Vitamin B12, Folate, WBC, Hgb, & Plts)- WNL Thyroid levels WNL Vit D levels low- Prescription sent to pharmacy

## 2015-03-05 ENCOUNTER — Other Ambulatory Visit: Payer: Self-pay | Admitting: Family

## 2015-04-20 ENCOUNTER — Ambulatory Visit (INDEPENDENT_AMBULATORY_CARE_PROVIDER_SITE_OTHER): Payer: BC Managed Care – PPO | Admitting: Family Medicine

## 2015-04-20 ENCOUNTER — Encounter: Payer: Self-pay | Admitting: Family Medicine

## 2015-04-20 VITALS — BP 105/69 | HR 95 | Temp 98.0°F | Ht 64.0 in | Wt 190.0 lb

## 2015-04-20 DIAGNOSIS — J329 Chronic sinusitis, unspecified: Secondary | ICD-10-CM | POA: Diagnosis not present

## 2015-04-20 DIAGNOSIS — J40 Bronchitis, not specified as acute or chronic: Secondary | ICD-10-CM | POA: Diagnosis not present

## 2015-04-20 MED ORDER — BETAMETHASONE SOD PHOS & ACET 6 (3-3) MG/ML IJ SUSP
6.0000 mg | Freq: Once | INTRAMUSCULAR | Status: AC
  Administered 2015-04-20: 6 mg via INTRAMUSCULAR

## 2015-04-20 MED ORDER — LEVOFLOXACIN 500 MG PO TABS: 500.0000 mg | ORAL_TABLET | Freq: Every day | ORAL | Status: DC

## 2015-04-20 MED ORDER — HYDROCODONE-HOMATROPINE 5-1.5 MG/5ML PO SYRP: 5.0000 mL | ORAL_SOLUTION | Freq: Four times a day (QID) | ORAL | Status: DC | PRN

## 2015-04-20 NOTE — Progress Notes (Signed)
Subjective:  Patient ID: Krystal PilaMelinda K Uhlir, female    DOB: May 19, 1976  Age: 39 y.o. MRN: [id number]  CC: URI   HPI Krystal Mckee presents for 2 weeks of increasing cough. The first few days it was more congestion in the nasal passages. That doesn't seem to bad now but she is blowing her nose quite frequently. There is a little bit of yellowing phlegm. She has some drainage down the back of her throat. She is coughing up some sputum but that seems rather clear although mucoid. This is happeningmultiple times through the day. She has not had fever or chills she does have occasional shortness of breath. This has been mild  History Krystal Mckee has a past medical history of Depression; Anxiety; Hypothyroidism; and Colon polyps.   She has past surgical history that includes Partial hysterectomy; Tonsillectomy and adenoidectomy; Cyst excision; Cyst excision; Cesarean section; and Colonoscopy (01/18/13).   Her family history includes Colon polyps in her father; Diabetes in her maternal grandmother; Ovarian cancer in her mother.She reports that she has been smoking.  She has never used smokeless tobacco. She reports that she does not drink alcohol or use illicit drugs.  Outpatient Prescriptions Prior to Visit  Medication Sig Dispense Refill  . cyclobenzaprine (FLEXERIL) 10 MG tablet Take 1 tablet (10 mg total) by mouth 3 (three) times daily as needed for muscle spasms. 60 tablet 3  . levothyroxine (SYNTHROID, LEVOTHROID) 88 MCG tablet TAKE ONE TABLET BY MOUTH ONCE DAILY 30 tablet 5  . meloxicam (MOBIC) 15 MG tablet Take 1 tablet (15 mg total) by mouth daily. 30 tablet 3  . PARoxetine (PAXIL) 40 MG tablet TAKE ONE TABLET BY MOUTH ONCE DAILY IN THE MORNING 30 tablet 1  . Vitamin D, Ergocalciferol, (DRISDOL) 50000 UNITS CAPS capsule Take 1 capsule (50,000 Units total) by mouth every 7 (seven) days. 12 capsule 3  . busPIRone (BUSPAR) 15 MG tablet Take 1 tablet (15 mg total) by mouth daily.  (Patient not taking: Reported on 04/20/2015) 30 tablet 1  . hydrOXYzine (ATARAX/VISTARIL) 25 MG tablet One po tid for anxiety and rash (Patient not taking: Reported on 04/20/2015) 30 tablet 3   No facility-administered medications prior to visit.    ROS Review of Systems  Constitutional: Negative for fever, chills, activity change and appetite change.  HENT: Positive for congestion, postnasal drip and rhinorrhea. Negative for ear discharge, ear pain, hearing loss, nosebleeds, sinus pressure, sneezing and trouble swallowing.   Respiratory: Positive for cough, chest tightness, shortness of breath and wheezing.   Cardiovascular: Negative for chest pain and palpitations.  Skin: Negative for rash.    Objective:  BP 105/69 mmHg  Pulse 95  Temp(Src) 98 F (36.7 C) (Oral)  Ht 5\' 4"  (1.626 m)  Wt 190 lb (86.183 kg)  BMI 32.60 kg/m2  BP Readings from Last 3 Encounters:  04/20/15 105/69  02/06/15 112/74  06/20/14 107/66    Wt Readings from Last 3 Encounters:  04/20/15 190 lb (86.183 kg)  02/06/15 192 lb (87.091 kg)  06/20/14 193 lb (87.544 kg)     Physical Exam  Constitutional: She appears well-developed and well-nourished.  HENT:  Head: Normocephalic and atraumatic.  Right Ear: Tympanic membrane and external ear normal. No decreased hearing is noted.  Left Ear: Tympanic membrane and external ear normal. No decreased hearing is noted.  Nose: Mucosal edema present. Right sinus exhibits no frontal sinus tenderness. Left sinus exhibits no frontal sinus tenderness.  Mouth/Throat: No oropharyngeal exudate or posterior oropharyngeal  erythema.  Neck: No Brudzinski's sign noted.  Pulmonary/Chest: Breath sounds normal. No respiratory distress.  Lymphadenopathy:       Head (right side): No preauricular adenopathy present.       Head (left side): No preauricular adenopathy present.       Right cervical: No superficial cervical adenopathy present.      Left cervical: No superficial cervical  adenopathy present.    Lab Results  Component Value Date   HGBA1C 4.9 07/02/2013    Lab Results  Component Value Date   WBC 7.9 02/06/2015   HGB 13.9 02/06/2015   HCT 40.4 02/06/2015   PLT 194 02/06/2015   GLUCOSE 90 02/06/2015   ALT 23 02/06/2015   AST 17 02/06/2015   NA 136 02/06/2015   K 4.3 02/06/2015   CL 100 02/06/2015   CREATININE 0.83 02/06/2015   BUN 13 02/06/2015   CO2 20 02/06/2015   TSH 2.660 02/06/2015   HGBA1C 4.9 07/02/2013    US Transvaginal Non-ob  01/19/2013   *RADIOLOGY REPORT*  Clinical Data: Dyspareunia.  TRANSABDOMINAL AND TRANSVAGINAL ULTRASOUND OF PELVIS  Technique:  Both transabdominal and transvaginal ultrasound examinations of the pelvis were performed.  Transabdominal technique was performed for global imaging of the pelvis including uterus, ovaries, adnexal regions, and pelvic cul-de-sac.  It was necessary to proceed with endovaginal exam following the transabdominal exam to visualize the ovaries.  Comparison:  None.  Findings: Uterus:  Surgically absent.  Vaginal cuff is unremarkable in appearance.  Right ovary: 3.4 x 2.5 x 3.1 cm.  Normal appearance.  No adnexal mass identified.  Left ovary: 3.9 x 2.7 x 3.8 cm.  Normal appearance.  No adnexal mass identified.  Other Findings:  No free fluid  IMPRESSION:  1.  Prior hysterectomy.  Normal appearance of both ovaries. 2.  No pelvic mass or other significant abnormality identified   Original Report Authenticated By: Myles Rosenthal, M.D.   US Pelvis Complete  01/19/2013   *RADIOLOGY REPORT*  Clinical Data: Dyspareunia.  TRANSABDOMINAL AND TRANSVAGINAL ULTRASOUND OF PELVIS  Technique:  Both transabdominal and transvaginal ultrasound examinations of the pelvis were performed.  Transabdominal technique was performed for global imaging of the pelvis including uterus, ovaries, adnexal regions, and pelvic cul-de-sac.  It was necessary to proceed with endovaginal exam following the transabdominal exam to visualize the  ovaries.  Comparison:  None.  Findings: Uterus:  Surgically absent.  Vaginal cuff is unremarkable in appearance.  Right ovary: 3.4 x 2.5 x 3.1 cm.  Normal appearance.  No adnexal mass identified.  Left ovary: 3.9 x 2.7 x 3.8 cm.  Normal appearance.  No adnexal mass identified.  Other Findings:  No free fluid  IMPRESSION:  1.  Prior hysterectomy.  Normal appearance of both ovaries. 2.  No pelvic mass or other significant abnormality identified   Original Report Authenticated By: Myles Rosenthal, M.D.    Assessment & Plan:   There are no diagnoses linked to this encounter. I have discontinued Ms. Rekowski's busPIRone and hydrOXYzine. I am also having her maintain her levothyroxine, meloxicam, cyclobenzaprine, Vitamin D (Ergocalciferol), and PARoxetine.  No orders of the defined types were placed in this encounter.     Follow-up: No Follow-up on file.  Mechele Claude, M.D.

## 2015-04-20 NOTE — Addendum Note (Signed)
Addended by: Bearl MulberryUTHERFORD, Raylene Carmickle K on: 04/20/2015 06:45 PM   Modules accepted: Kipp BroodSmartSet

## 2015-05-22 ENCOUNTER — Encounter: Payer: Self-pay | Admitting: Family

## 2015-05-22 ENCOUNTER — Ambulatory Visit (INDEPENDENT_AMBULATORY_CARE_PROVIDER_SITE_OTHER): Payer: BC Managed Care – PPO | Admitting: Family

## 2015-05-22 VITALS — BP 114/80 | HR 96 | Temp 98.0°F | Ht 64.0 in | Wt 186.8 lb

## 2015-05-22 DIAGNOSIS — R197 Diarrhea, unspecified: Secondary | ICD-10-CM | POA: Diagnosis not present

## 2015-05-22 DIAGNOSIS — R112 Nausea with vomiting, unspecified: Secondary | ICD-10-CM | POA: Diagnosis not present

## 2015-05-22 LAB — POCT CBC
Granulocyte percent: 68.7 %G (ref 37–80)
HEMATOCRIT: 47.1 % (ref 37.7–47.9)
Hemoglobin: 14.9 g/dL (ref 12.2–16.2)
Lymph, poc: 1.5 (ref 0.6–3.4)
MCH, POC: 28.2 pg (ref 27–31.2)
MCHC: 31.7 g/dL — AB (ref 31.8–35.4)
MCV: 88.9 fL (ref 80–97)
MPV: 7.8 fL (ref 0–99.8)
POC GRANULOCYTE: 4.1 (ref 2–6.9)
POC LYMPH PERCENT: 25.7 %L (ref 10–50)
Platelet Count, POC: 207 10*3/uL (ref 142–424)
RBC: 5.3 M/uL (ref 4.04–5.48)
RDW, POC: 13.4 %
WBC: 6 10*3/uL (ref 4.6–10.2)

## 2015-05-22 MED ORDER — CIPROFLOXACIN HCL 500 MG PO TABS: 500.0000 mg | ORAL_TABLET | Freq: Two times a day (BID) | ORAL | Status: DC

## 2015-05-22 MED ORDER — PROMETHAZINE HCL 12.5 MG RE SUPP: 12.5000 mg | Freq: Four times a day (QID) | RECTAL | Status: DC | PRN

## 2015-05-22 MED ORDER — ONDANSETRON HCL 4 MG PO TABS: 4.0000 mg | ORAL_TABLET | Freq: Three times a day (TID) | ORAL | Status: DC | PRN

## 2015-05-22 NOTE — Patient Instructions (Signed)

## 2015-05-22 NOTE — Progress Notes (Signed)
Subjective:    Patient ID: Krystal Mckee, female    DOB: Aug 24, 1976, 39 y.o.   MRN: [id number]  PT presents to the office today for diarrhea with N& V that started on Friday. PT states she is feeling the same with no improvements.  Emesis  This is a new problem. The current episode started in the past 7 days (Friday). The problem occurs more than 10 times per day. The problem has been waxing and waning. The emesis has an appearance of stomach contents. Associated symptoms include abdominal pain and diarrhea. Pertinent negatives include no chills, dizziness, fever, headaches or myalgias. She has tried bed rest and increased fluids for the symptoms. The treatment provided mild relief.  Diarrhea  This is a new problem. The current episode started in the past 7 days (Friday). The problem occurs more than 10 times per day. The problem has been unchanged. Associated symptoms include abdominal pain and vomiting. Pertinent negatives include no chills, fever, headaches or myalgias.      Review of Systems  Constitutional: Negative.  Negative for fever and chills.  HENT: Negative.   Eyes: Negative.   Respiratory: Negative.  Negative for shortness of breath.   Cardiovascular: Negative.  Negative for palpitations.  Gastrointestinal: Positive for vomiting, abdominal pain and diarrhea.  Endocrine: Negative.   Genitourinary: Negative.   Musculoskeletal: Negative.  Negative for myalgias.  Neurological: Negative.  Negative for dizziness and headaches.  Hematological: Negative.   Psychiatric/Behavioral: Negative.   All other systems reviewed and are negative.      Objective:   Physical Exam  Constitutional: She is oriented to person, place, and time. She appears well-developed and well-nourished. No distress.  HENT:  Head: Normocephalic and atraumatic.  Right Ear: External ear normal.  Left Ear: External ear normal.  Nose: Nose normal.  Mouth/Throat: Oropharynx is clear and moist.    Eyes: Pupils are equal, round, and reactive to light.  Neck: Normal range of motion. Neck supple. No thyromegaly present.  Cardiovascular: Normal rate, regular rhythm, normal heart sounds and intact distal pulses.   No murmur heard. Pulmonary/Chest: Effort normal and breath sounds normal. No respiratory distress. She has no wheezes.  Abdominal: Soft. Bowel sounds are normal. She exhibits no distension. There is no tenderness.  Hyperactive BS   Musculoskeletal: Normal range of motion. She exhibits no edema or tenderness.  Neurological: She is alert and oriented to person, place, and time. She has normal reflexes. No cranial nerve deficit.  Skin: Skin is warm and dry.  Psychiatric: She has a normal mood and affect. Her behavior is normal. Judgment and thought content normal.  Vitals reviewed.     BP 114/80 mmHg  Pulse 96  Temp(Src) 98 F (36.7 C) (Oral)  Ht 5\' 4"  (1.626 m)  Wt 186 lb 12.8 oz (84.732 kg)  BMI 32.05 kg/m2     Assessment & Plan:  1. Diarrhea - POCT CBC - ciprofloxacin (CIPRO) 500 MG tablet; Take 1 tablet (500 mg total) by mouth 2 (two) times daily.  Dispense: 10 tablet; Refill: 0 - promethazine (PHENERGAN) 12.5 MG suppository; Place 1 suppository (12.5 mg total) rectally every 6 (six) hours as needed for nausea or vomiting.  Dispense: 12 each; Refill: 0 - ondansetron (ZOFRAN) 4 MG tablet; Take 1 tablet (4 mg total) by mouth every 8 (eight) hours as needed for nausea or vomiting.  Dispense: 20 tablet; Refill: 0  2. Non-intractable vomiting with nausea, vomiting of unspecified type - POCT CBC - ciprofloxacin (CIPRO)  500 MG tablet; Take 1 tablet (500 mg total) by mouth 2 (two) times daily.  Dispense: 10 tablet; Refill: 0 - promethazine (PHENERGAN) 12.5 MG suppository; Place 1 suppository (12.5 mg total) rectally every 6 (six) hours as needed for nausea or vomiting.  Dispense: 12 each; Refill: 0 - ondansetron (ZOFRAN) 4 MG tablet; Take 1 tablet (4 mg total) by mouth  every 8 (eight) hours as needed for nausea or vomiting.  Dispense: 20 tablet; Refill: 0  Bland diet Force fluids Imodium as needed RTO prn   Jannifer Rodney, FNP

## 2015-06-08 ENCOUNTER — Other Ambulatory Visit: Payer: Self-pay | Admitting: Family

## 2015-07-03 ENCOUNTER — Other Ambulatory Visit: Payer: Self-pay | Admitting: Nurse Practitioner

## 2015-08-17 ENCOUNTER — Ambulatory Visit: Payer: Self-pay | Admitting: Family

## 2015-08-18 ENCOUNTER — Encounter: Payer: Self-pay | Admitting: Nurse Practitioner

## 2015-08-29 ENCOUNTER — Ambulatory Visit (INDEPENDENT_AMBULATORY_CARE_PROVIDER_SITE_OTHER): Payer: BC Managed Care – PPO | Admitting: Family

## 2015-08-29 ENCOUNTER — Encounter: Payer: Self-pay | Admitting: Family

## 2015-08-29 VITALS — BP 117/82 | HR 97 | Temp 98.3°F | Ht 64.0 in | Wt 193.4 lb

## 2015-08-29 DIAGNOSIS — F329 Major depressive disorder, single episode, unspecified: Secondary | ICD-10-CM

## 2015-08-29 DIAGNOSIS — E039 Hypothyroidism, unspecified: Secondary | ICD-10-CM | POA: Diagnosis not present

## 2015-08-29 DIAGNOSIS — F411 Generalized anxiety disorder: Secondary | ICD-10-CM

## 2015-08-29 DIAGNOSIS — Z23 Encounter for immunization: Secondary | ICD-10-CM

## 2015-08-29 DIAGNOSIS — E559 Vitamin D deficiency, unspecified: Secondary | ICD-10-CM | POA: Diagnosis not present

## 2015-08-29 DIAGNOSIS — J309 Allergic rhinitis, unspecified: Secondary | ICD-10-CM

## 2015-08-29 DIAGNOSIS — Z13220 Encounter for screening for lipoid disorders: Secondary | ICD-10-CM | POA: Diagnosis not present

## 2015-08-29 DIAGNOSIS — G4700 Insomnia, unspecified: Secondary | ICD-10-CM | POA: Diagnosis not present

## 2015-08-29 DIAGNOSIS — F32A Depression, unspecified: Secondary | ICD-10-CM

## 2015-08-29 MED ORDER — TRAZODONE HCL 50 MG PO TABS: 25.0000 mg | ORAL_TABLET | Freq: Every evening | ORAL | Status: DC | PRN

## 2015-08-29 MED ORDER — FLUTICASONE PROPIONATE 50 MCG/ACT NA SUSP: 2.0000 | Freq: Every day | NASAL | Status: DC

## 2015-08-29 MED ORDER — BUPROPION HCL 100 MG PO TABS: 100.0000 mg | ORAL_TABLET | Freq: Two times a day (BID) | ORAL | Status: DC

## 2015-08-29 NOTE — Patient Instructions (Signed)
Generalized Anxiety Disorder Generalized anxiety disorder (GAD) is a mental disorder. It interferes with life functions, including relationships, work, and school. GAD is different from normal anxiety, which everyone experiences at some point in their lives in response to specific life events and activities. Normal anxiety actually helps us prepare for and get through these life events and activities. Normal anxiety goes away after the event or activity is over.  GAD causes anxiety that is not necessarily related to specific events or activities. It also causes excess anxiety in proportion to specific events or activities. The anxiety associated with GAD is also difficult to control. GAD can vary from mild to severe. People with severe GAD can have intense waves of anxiety with physical symptoms (panic attacks).  SYMPTOMS The anxiety and worry associated with GAD are difficult to control. This anxiety and worry are related to many life events and activities and also occur more days than not for 6 months or longer. People with GAD also have three or more of the following symptoms (one or more in children):  Restlessness.   Fatigue.  Difficulty concentrating.   Irritability.  Muscle tension.  Difficulty sleeping or unsatisfying sleep. DIAGNOSIS GAD is diagnosed through an assessment by your health care provider. Your health care provider will ask you questions aboutyour mood,physical symptoms, and events in your life. Your health care provider may ask you about your medical history and use of alcohol or drugs, including prescription medicines. Your health care provider may also do a physical exam and blood tests. Certain medical conditions and the use of certain substances can cause symptoms similar to those associated with GAD. Your health care provider may refer you to a mental health specialist for further evaluation. TREATMENT The following therapies are usually used to treat GAD:    Medication. Antidepressant medication usually is prescribed for long-term daily control. Antianxiety medicines may be added in severe cases, especially when panic attacks occur.   Talk therapy (psychotherapy). Certain types of talk therapy can be helpful in treating GAD by providing support, education, and guidance. A form of talk therapy called cognitive behavioral therapy can teach you healthy ways to think about and react to daily life events and activities.  Stress managementtechniques. These include yoga, meditation, and exercise and can be very helpful when they are practiced regularly. A mental health specialist can help determine which treatment is best for you. Some people see improvement with one therapy. However, other people require a combination of therapies.   This information is not intended to replace advice given to you by your health care provider. Make sure you discuss any questions you have with your health care provider.   Document Released: 02/01/2013 Document Revised: 10/28/2014 Document Reviewed: 02/01/2013 Elsevier Interactive Patient Education 2016 [employer]  

## 2015-08-29 NOTE — Progress Notes (Addendum)
Subjective:    Patient ID: Krystal Mckee, female    DOB: 1975/11/29, 39 y.o.   MRN: [id number]  Pt presents to the office today for chronic follow up. Pt states her GAD is not controlled. Pt states she loves her Paxil, but states over the last few months she has noticed her anxiety has increased with insomnia, increase crying, nervous, and increased worrying.  Anxiety Presents for follow-up visit. Onset was 1 to 5 years ago. The problem has been gradually worsening. Symptoms include depressed mood, excessive worry, insomnia, irritability, muscle tension, nervous/anxious behavior, obsessions, panic and restlessness. Patient reports no palpitations, shortness of breath or suicidal ideas. Symptoms occur occasionally. The symptoms are aggravated by family issues. The quality of sleep is poor.   Her past medical history is significant for anxiety/panic attacks and depression. Past treatments include SSRIs. The treatment provided moderate relief. Compliance with prior treatments has been good.  Thyroid Problem Presents for follow-up visit. Symptoms include anxiety, constipation, depressed mood, dry skin and fatigue. Patient reports no diarrhea, hoarse voice, palpitations or visual change. The symptoms have been worsening. Past treatments include levothyroxine. The treatment provided moderate relief.  Depression      The patient presents with depression.  This is a chronic problem.  The current episode started more than 1 year ago.   The onset quality is gradual.   The problem occurs intermittently.  Associated symptoms include fatigue, insomnia, irritable and restlessness.  Associated symptoms include no headaches and no suicidal ideas.     The symptoms are aggravated by work stress.  Compliance with treatment is good.  Past medical history includes thyroid problem, anxiety and depression.       Review of Systems  Constitutional: Positive for irritability and fatigue.  HENT: Negative.   Negative for hoarse voice.   Eyes: Negative.   Respiratory: Negative.  Negative for shortness of breath.   Cardiovascular: Negative.  Negative for palpitations.  Gastrointestinal: Positive for constipation. Negative for diarrhea.  Endocrine: Negative.   Genitourinary: Negative.   Musculoskeletal: Negative.   Neurological: Negative.  Negative for headaches.  Hematological: Negative.   Psychiatric/Behavioral: Positive for depression. Negative for suicidal ideas. The patient is nervous/anxious and has insomnia.   All other systems reviewed and are negative.      Objective:   Physical Exam  Constitutional: She is oriented to person, place, and time. She appears well-developed and well-nourished. She is irritable. No distress.  HENT:  Head: Normocephalic and atraumatic.  Right Ear: External ear normal. A middle ear effusion is present.  Left Ear: External ear normal. A middle ear effusion is present.  Mouth/Throat: Oropharynx is clear and moist.  Nasal passage erythemas with mild swelling    Eyes: Pupils are equal, round, and reactive to light.  Neck: Normal range of motion. Neck supple. No thyromegaly present.  Cardiovascular: Normal rate, regular rhythm, normal heart sounds and intact distal pulses.   No murmur heard. Pulmonary/Chest: Effort normal and breath sounds normal. No respiratory distress. She has no wheezes.  Abdominal: Soft. Bowel sounds are normal. She exhibits no distension. There is no tenderness.  Musculoskeletal: Normal range of motion. She exhibits no edema or tenderness.  Neurological: She is alert and oriented to person, place, and time. She has normal reflexes. No cranial nerve deficit.  Skin: Skin is warm and dry.  Psychiatric: She has a normal mood and affect. Her behavior is normal. Judgment and thought content normal.  Vitals reviewed.   BP 117/82 mmHg  Pulse 97  Temp(Src) 98.3 F (36.8 C) (Oral)  Ht 5' 4"  (1.626 m)  Wt 193 lb 6.4 oz (87.726 kg)  BMI  33.18 kg/m2       Assessment & Plan:  1. Vitamin D deficiency - CMP14+EGFR - Vit D  25 hydroxy (rtn osteoporosis monitoring)  2. GAD (generalized anxiety disorder) -Stress management discussed -Wellbutrin added today - CMP14+EGFR - buPROPion (WELLBUTRIN) 100 MG tablet; Take 1 tablet (100 mg total) by mouth 2 (two) times daily.  Dispense: 90 tablet; Refill: 1  3. Depression -Wellbutrin added today - CMP14+EGFR - buPROPion (WELLBUTRIN) 100 MG tablet; Take 1 tablet (100 mg total) by mouth 2 (two) times daily.  Dispense: 90 tablet; Refill: 1  4. Hypothyroidism, unspecified hypothyroidism type - CMP14+EGFR - Thyroid Panel With TSH  5. Screening cholesterol level - CMP14+EGFR - Lipid panel  6. Insomnia -Sleep ritual discussed - CMP14+EGFR - traZODone (DESYREL) 50 MG tablet; Take 0.5-1 tablets (25-50 mg total) by mouth at bedtime as needed for sleep.  Dispense: 30 tablet; Refill: 3  7. Allergic rhinitis, unspecified allergic rhinitis type - CMP14+EGFR - fluticasone (FLONASE) 50 MCG/ACT nasal spray; Place 2 sprays into both nostrils daily.  Dispense: 16 g; Refill: 6   Continue all meds Labs pending Health Maintenance reviewed- Flu vaccine given today, pt to schedule mammogram Diet and exercise encouraged RTO 2 months  Evelina Dun, FNP

## 2015-08-30 LAB — CMP14+EGFR
ALT: 26 IU/L (ref 0–32)
AST: 27 IU/L (ref 0–40)
Albumin/Globulin Ratio: 1.7 (ref 1.1–2.5)
Albumin: 4.6 g/dL (ref 3.5–5.5)
Alkaline Phosphatase: 68 IU/L (ref 39–117)
BUN/Creatinine Ratio: 9 (ref 9–23)
BUN: 8 mg/dL (ref 6–24)
Bilirubin Total: 0.7 mg/dL (ref 0.0–1.2)
CALCIUM: 9.7 mg/dL (ref 8.7–10.2)
CHLORIDE: 97 mmol/L (ref 97–106)
CO2: 22 mmol/L (ref 18–29)
Creatinine, Ser: 0.9 mg/dL (ref 0.57–1.00)
GFR, EST AFRICAN AMERICAN: 92 mL/min/{1.73_m2} (ref >59)
GFR, EST NON AFRICAN AMERICAN: 80 mL/min/{1.73_m2} (ref >59)
GLUCOSE: 80 mg/dL (ref 65–99)
Globulin, Total: 2.7 g/dL (ref 1.5–4.5)
Potassium: 4.8 mmol/L (ref 3.5–5.2)
Sodium: 137 mmol/L (ref 136–144)
TOTAL PROTEIN: 7.3 g/dL (ref 6.0–8.5)

## 2015-08-30 LAB — THYROID PANEL WITH TSH
FREE THYROXINE INDEX: 1.9 (ref 1.2–4.9)
T3 Uptake Ratio: 22 % — ABNORMAL LOW (ref 24–39)
T4 TOTAL: 8.6 ug/dL (ref 4.5–12.0)
TSH: 1.5 u[IU]/mL (ref 0.450–4.500)

## 2015-08-30 LAB — LIPID PANEL
Chol/HDL Ratio: 5.7 ratio units — ABNORMAL HIGH (ref 0.0–4.4)
Cholesterol, Total: 232 mg/dL — ABNORMAL HIGH (ref 100–199)
HDL: 41 mg/dL (ref >39)
LDL Calculated: 135 mg/dL — ABNORMAL HIGH (ref 0–99)
Triglycerides: 279 mg/dL — ABNORMAL HIGH (ref 0–149)
VLDL CHOLESTEROL CAL: 56 mg/dL — AB (ref 5–40)

## 2015-08-30 LAB — VITAMIN D 25 HYDROXY (VIT D DEFICIENCY, FRACTURES): VIT D 25 HYDROXY: 16.6 ng/mL — AB (ref 30.0–100.0)

## 2015-08-31 ENCOUNTER — Telehealth: Payer: Self-pay | Admitting: Family

## 2015-08-31 ENCOUNTER — Other Ambulatory Visit: Payer: Self-pay | Admitting: Family

## 2015-08-31 DIAGNOSIS — E785 Hyperlipidemia, unspecified: Secondary | ICD-10-CM

## 2015-08-31 MED ORDER — VITAMIN D (ERGOCALCIFEROL) 1.25 MG (50000 UNIT) PO CAPS
50000.0000 [IU] | ORAL_CAPSULE | ORAL | Status: DC
Start: 2015-08-31 — End: 2017-08-01

## 2015-08-31 MED ORDER — ATORVASTATIN CALCIUM 20 MG PO TABS: 20.0000 mg | ORAL_TABLET | Freq: Every day | ORAL | Status: DC

## 2015-08-31 NOTE — Telephone Encounter (Signed)
Reviewed labs with pt. 

## 2015-09-19 ENCOUNTER — Other Ambulatory Visit: Payer: Self-pay | Admitting: Family

## 2015-10-10 ENCOUNTER — Ambulatory Visit (INDEPENDENT_AMBULATORY_CARE_PROVIDER_SITE_OTHER): Payer: BC Managed Care – PPO | Admitting: Family

## 2015-10-10 ENCOUNTER — Encounter: Payer: Self-pay | Admitting: Family

## 2015-10-10 VITALS — BP 104/64 | HR 80 | Temp 97.5°F | Ht 64.0 in | Wt 190.4 lb

## 2015-10-10 DIAGNOSIS — M545 Low back pain, unspecified: Secondary | ICD-10-CM

## 2015-10-10 DIAGNOSIS — M25562 Pain in left knee: Secondary | ICD-10-CM

## 2015-10-10 DIAGNOSIS — M25561 Pain in right knee: Secondary | ICD-10-CM | POA: Diagnosis not present

## 2015-10-10 MED ORDER — NAPROXEN 500 MG PO TABS: 500.0000 mg | ORAL_TABLET | Freq: Two times a day (BID) | ORAL | Status: DC

## 2015-10-10 MED ORDER — METHYLPREDNISOLONE ACETATE 80 MG/ML IJ SUSP
80.0000 mg | Freq: Once | INTRAMUSCULAR | Status: AC
  Administered 2015-10-10: 80 mg via INTRAMUSCULAR

## 2015-10-10 MED ORDER — KETOROLAC TROMETHAMINE 60 MG/2ML IM SOLN
60.0000 mg | Freq: Once | INTRAMUSCULAR | Status: AC
  Administered 2015-10-10: 60 mg via INTRAMUSCULAR

## 2015-10-10 NOTE — Patient Instructions (Signed)

## 2015-10-10 NOTE — Progress Notes (Signed)
Subjective:    Patient ID: Krystal Mckee, female    DOB: 05-Aug-1976, 39 y.o.   MRN: [id number]  Back Pain This is a chronic problem. The current episode started more than 1 year ago. The problem occurs intermittently. The problem has been waxing and waning since onset. The pain is present in the lumbar spine. The quality of the pain is described as aching. The pain is at a severity of 10/10. The pain is moderate. The symptoms are aggravated by bending and twisting. Pertinent negatives include no bladder incontinence, bowel incontinence, headaches, numbness or tingling. She has tried bed rest and NSAIDs for the symptoms. The treatment provided mild relief.  Knee Pain  The incident occurred more than 1 week ago. There was no injury mechanism. The pain is present in the left knee and right knee. The quality of the pain is described as aching. The pain is at a severity of 9/10 (while walking). The pain is mild. The pain has been fluctuating since onset. Pertinent negatives include no numbness or tingling. She reports no foreign bodies present. The symptoms are aggravated by movement. She has tried acetaminophen, NSAIDs and rest for the symptoms. The treatment provided mild relief.      Review of Systems  Constitutional: Negative.   HENT: Negative.   Eyes: Negative.   Respiratory: Negative.  Negative for shortness of breath.   Cardiovascular: Negative.  Negative for palpitations.  Gastrointestinal: Negative.  Negative for bowel incontinence.  Endocrine: Negative.   Genitourinary: Negative.  Negative for bladder incontinence.  Musculoskeletal: Positive for back pain.  Neurological: Negative.  Negative for tingling, numbness and headaches.  Hematological: Negative.   Psychiatric/Behavioral: Negative.   All other systems reviewed and are negative.      Objective:   Physical Exam  Constitutional: She is oriented to person, place, and time. She appears well-developed and  well-nourished. No distress.  HENT:  Head: Normocephalic and atraumatic.  Eyes: Pupils are equal, round, and reactive to light.  Neck: Normal range of motion. Neck supple. No thyromegaly present.  Cardiovascular: Normal rate, regular rhythm, normal heart sounds and intact distal pulses.   No murmur heard. Pulmonary/Chest: Effort normal and breath sounds normal. No respiratory distress. She has no wheezes.  Abdominal: Soft. Bowel sounds are normal. She exhibits no distension. There is no tenderness.  Musculoskeletal: Normal range of motion. She exhibits no edema or tenderness.  Neurological: She is alert and oriented to person, place, and time. She has normal reflexes. No cranial nerve deficit.  Skin: Skin is warm and dry.  Psychiatric: She has a normal mood and affect. Her behavior is normal. Judgment and thought content normal.  Vitals reviewed.    BP 104/64 mmHg  Pulse 80  Temp(Src) 97.5 F (36.4 C) (Oral)  Ht 5' 4"  (1.626 m)  Wt 190 lb 6.4 oz (86.365 kg)  BMI 32.67 kg/m2      Assessment & Plan:  1. Right-sided low back pain without sciatica -Rest --Ice and heat as needed -No other NSAID's, take with food - Arthritis Panel - methylPREDNISolone acetate (DEPO-MEDROL) injection 80 mg; Inject 1 mL (80 mg total) into the muscle once. - ketorolac (TORADOL) injection 60 mg; Inject 2 mLs (60 mg total) into the muscle once. - naproxen (NAPROSYN) 500 MG tablet; Take 1 tablet (500 mg total) by mouth 2 (two) times daily with a meal.  Dispense: 60 tablet; Refill: 1 - CMP14+EGFR  2. Bilateral knee pain -Rest -ROM exercises discussed -RTO prn  -  Arthritis Panel - methylPREDNISolone acetate (DEPO-MEDROL) injection 80 mg; Inject 1 mL (80 mg total) into the muscle once. - ketorolac (TORADOL) injection 60 mg; Inject 2 mLs (60 mg total) into the muscle once. - naproxen (NAPROSYN) 500 MG tablet; Take 1 tablet (500 mg total) by mouth 2 (two) times daily with a meal.  Dispense: 60 tablet;  Refill: 1 - Amanda Park, FNP

## 2015-10-11 LAB — ARTHRITIS PANEL
BASOS ABS: 0 10*3/uL (ref 0.0–0.2)
Basos: 0 %
EOS (ABSOLUTE): 0.2 10*3/uL (ref 0.0–0.4)
Eos: 3 %
HEMOGLOBIN: 13.9 g/dL (ref 11.1–15.9)
Hematocrit: 39.2 % (ref 34.0–46.6)
IMMATURE GRANS (ABS): 0 10*3/uL (ref 0.0–0.1)
IMMATURE GRANULOCYTES: 0 %
LYMPHS: 24 %
Lymphocytes Absolute: 1.6 10*3/uL (ref 0.7–3.1)
MCH: 32 pg (ref 26.6–33.0)
MCHC: 35.5 g/dL (ref 31.5–35.7)
MCV: 90 fL (ref 79–97)
MONOCYTES: 8 %
Monocytes Absolute: 0.5 10*3/uL (ref 0.1–0.9)
NEUTROS ABS: 4.5 10*3/uL (ref 1.4–7.0)
NEUTROS PCT: 65 %
PLATELETS: 182 10*3/uL (ref 150–379)
RBC: 4.35 x10E6/uL (ref 3.77–5.28)
RDW: 13.2 % (ref 12.3–15.4)
SED RATE: 5 mm/h (ref 0–32)
URIC ACID: 5.1 mg/dL (ref 2.5–7.1)
WBC: 6.9 10*3/uL (ref 3.4–10.8)

## 2015-10-11 LAB — CMP14+EGFR
A/G RATIO: 2 (ref 1.1–2.5)
ALT: 25 IU/L (ref 0–32)
AST: 20 IU/L (ref 0–40)
Albumin: 4.6 g/dL (ref 3.5–5.5)
Alkaline Phosphatase: 67 IU/L (ref 39–117)
BUN / CREAT RATIO: 15 (ref 9–23)
BUN: 12 mg/dL (ref 6–24)
Bilirubin Total: 0.9 mg/dL (ref 0.0–1.2)
CALCIUM: 9.3 mg/dL (ref 8.7–10.2)
CO2: 25 mmol/L (ref 18–29)
Chloride: 100 mmol/L (ref 96–106)
Creatinine, Ser: 0.79 mg/dL (ref 0.57–1.00)
GFR calc Af Amer: 108 mL/min/{1.73_m2} (ref >59)
GFR, EST NON AFRICAN AMERICAN: 94 mL/min/{1.73_m2} (ref >59)
GLOBULIN, TOTAL: 2.3 g/dL (ref 1.5–4.5)
Glucose: 108 mg/dL — ABNORMAL HIGH (ref 65–99)
POTASSIUM: 4.4 mmol/L (ref 3.5–5.2)
SODIUM: 138 mmol/L (ref 134–144)
Total Protein: 6.9 g/dL (ref 6.0–8.5)

## 2015-10-12 ENCOUNTER — Ambulatory Visit: Payer: Self-pay | Admitting: Family

## 2015-12-13 ENCOUNTER — Encounter: Payer: Self-pay | Admitting: Pediatrics

## 2015-12-13 ENCOUNTER — Ambulatory Visit (INDEPENDENT_AMBULATORY_CARE_PROVIDER_SITE_OTHER): Payer: BC Managed Care – PPO | Admitting: Pediatrics

## 2015-12-13 VITALS — BP 118/87 | HR 77 | Temp 96.8°F | Ht 64.0 in | Wt 188.2 lb

## 2015-12-13 DIAGNOSIS — F32A Depression, unspecified: Secondary | ICD-10-CM

## 2015-12-13 DIAGNOSIS — G4700 Insomnia, unspecified: Secondary | ICD-10-CM | POA: Diagnosis not present

## 2015-12-13 DIAGNOSIS — F411 Generalized anxiety disorder: Secondary | ICD-10-CM

## 2015-12-13 DIAGNOSIS — G43809 Other migraine, not intractable, without status migrainosus: Secondary | ICD-10-CM | POA: Diagnosis not present

## 2015-12-13 DIAGNOSIS — J029 Acute pharyngitis, unspecified: Secondary | ICD-10-CM

## 2015-12-13 DIAGNOSIS — F329 Major depressive disorder, single episode, unspecified: Secondary | ICD-10-CM | POA: Diagnosis not present

## 2015-12-13 DIAGNOSIS — E785 Hyperlipidemia, unspecified: Secondary | ICD-10-CM | POA: Diagnosis not present

## 2015-12-13 MED ORDER — ONDANSETRON 4 MG PO TBDP: 4.0000 mg | ORAL_TABLET | Freq: Three times a day (TID) | ORAL | Status: DC | PRN

## 2015-12-13 MED ORDER — RIZATRIPTAN BENZOATE 10 MG PO TABS: 10.0000 mg | ORAL_TABLET | ORAL | Status: DC | PRN

## 2015-12-13 MED ORDER — TRAZODONE HCL 50 MG PO TABS: 150.0000 mg | ORAL_TABLET | Freq: Every evening | ORAL | Status: DC | PRN

## 2015-12-13 NOTE — Progress Notes (Signed)
Subjective:    Patient ID: Krystal Mckee, female    DOB: [birth year], 40 y.o.   MRN: [id number]  CC: Headache   HPI: Krystal Mckee is a 41 y.o. female presenting for Headache  Headache started yesterday morning Threw up yesterday Took  ibuprofen, threw it up Feels like her head is squeezing in different places [place] and went to sleep, still hurting Had stomach ache 4 days ago Has had ear pain since Friday Has had some mucus/congestion Early 20s used to get migraines, took imitrex for it A few weeks ago started getting headaches a couple times a week Headaches not related to periods in the past, now s/p hysterectomy Doesn't remember what headaches used to feel like Sensitive sound, sensitive to light  H/o anx and depression. Has been seen by counselor in past, not found it helpful, talked about childhood and she says her childhood is fine Sometimes has thoughts about not wanting to be here but denies thoughts of self-harm. Has kids that need her she says and would never do anything to hurt herself  Insomnia: lies awake to an hour thinking most nights. Taking  trazodone a night   Depression screen [facility] 2/9 12/13/2015 12/13/2015 08/29/2015 05/22/2015 02/06/2015  Decreased Interest 2 0 0 0 0  Down, Depressed, Hopeless 1 0 0 0 0  PHQ - 2 Score 3 0 0 0 0  Altered sleeping 3 - - - -  Tired, decreased energy 3 - - - -  Change in appetite 3 - - - -  Feeling bad or failure about yourself  2 - - - -  Trouble concentrating 1 - - - -  Moving slowly or fidgety/restless 1 - - - -  Suicidal thoughts 1 - - - -  PHQ-9 Score 17 - - - -  Difficult doing work/chores Somewhat difficult - - - -     Relevant past medical, surgical, family and social history reviewed and updated as indicated. Interim medical history since our last visit reviewed. Allergies and medications reviewed and updated.    ROS: Per HPI unless specifically indicated above  History    Smoking status  . Current Every Day Smoker -- 0.25 packs/day  Smokeless tobacco  . Never Used    Past Medical History Patient Active Problem List   Diagnosis Date Noted  . Hyperlipidemia 08/31/2015  . Vitamin D deficiency 02/07/2015  . Hypothyroidism 05/31/2014  . Depression 05/31/2014  . GAD (generalized anxiety disorder) 05/31/2014  . Syncope 02/18/2013  . Dyspareunia 01/12/2013  . IBS (irritable colon syndrome) 01/12/2013       Objective:    BP 118/87 mmHg  Pulse 77  Temp(Src) 96.8 F (36 C) (Oral)  Ht  (1.626 m)  Wt 188 lb 3.2 oz (85.367 kg)  BMI 32.29 kg/m2  Wt Readings from Last 3 Encounters:  12/13/15 188 lb 3.2 oz (85.367 kg)  10/10/15 190 lb 6.4 oz (86.365 kg)  08/29/15 193 lb 6.4 oz (87.726 kg)     Gen: NAD, alert, cooperative with exam, NCAT EYES: EOMI, no scleral injection or icterus ENT:  TMs pearly gray b/l, clear effusion b/l with slightly splayed LR, OP without erythema LYMPH: no cervical LAD CV: NRRR, normal S1/S2, no murmur, distal pulses 2+ b/l Resp: CTABL, no wheezes, normal WOB Abd: +BS, soft, NTND. no guarding or organomegaly Ext: No edema, warm Neuro: Alert and oriented, strength equal b/l UE and LE, coordination grossly normal MSK: normal muscle bulk Psych:  tearful at times, full affect     Assessment & Plan:    Chantil was seen today for headache and other med problems as below  Diagnoses and all orders for this visit:  Insomnia Increase to  nightly, also will help treat depression.  -     traZODone (DESYREL) 50 MG tablet; Take 3 tablets (150 mg total) by mouth at bedtime as needed for sleep.  Other migraine without status migrainosus, not intractable -     rizatriptan (MAXALT) 10 MG tablet; Take 1 tablet (10 mg total) by mouth as needed for migraine. May repeat in 2 hours if needed -     ondansetron (ZOFRAN-ODT) 4 MG disintegrating tablet; Take 1 tablet (4 mg total) by mouth every 8 (eight) hours as needed for nausea  or vomiting.  Sore throat Likely due to URI, discussed sx treatment, continue flonase.  GAD (generalized anxiety disorder) Recurring thoughts, start counseling  Depression Cont paxil, says has been most helpful thing she has been on so far. Takes i tin th emorning. Gave list of counselors, very open to trying counseling again  Hyperlipidemia Has been worrying pt, no fam hx of early MI or stroke. ASCVD risk over 10 yrs 4.9%, down to 1.2% if she stops smoking. Discussed smoking cessation best thing she could do to decrease risk.   Follow up plan: Return in about 3 weeks (around 01/03/2016).  Rex Kras, MD [facility] 12/13/2015, 11:22 AM

## 2016-01-04 ENCOUNTER — Ambulatory Visit: Payer: BC Managed Care – PPO | Admitting: Family

## 2016-01-11 ENCOUNTER — Ambulatory Visit: Payer: BC Managed Care – PPO | Admitting: Family

## 2016-01-12 ENCOUNTER — Encounter: Payer: Self-pay | Admitting: Family

## 2016-01-18 ENCOUNTER — Other Ambulatory Visit: Payer: Self-pay | Admitting: Family Medicine

## 2016-03-04 ENCOUNTER — Other Ambulatory Visit: Payer: Self-pay | Admitting: Family

## 2016-03-15 ENCOUNTER — Ambulatory Visit (INDEPENDENT_AMBULATORY_CARE_PROVIDER_SITE_OTHER): Payer: BC Managed Care – PPO

## 2016-03-15 ENCOUNTER — Ambulatory Visit (INDEPENDENT_AMBULATORY_CARE_PROVIDER_SITE_OTHER): Payer: BC Managed Care – PPO | Admitting: Family

## 2016-03-15 ENCOUNTER — Encounter: Payer: Self-pay | Admitting: Family

## 2016-03-15 VITALS — BP 101/65 | HR 75 | Temp 98.0°F | Ht 64.0 in | Wt 193.2 lb

## 2016-03-15 DIAGNOSIS — M5441 Lumbago with sciatica, right side: Secondary | ICD-10-CM

## 2016-03-15 DIAGNOSIS — E669 Obesity, unspecified: Secondary | ICD-10-CM | POA: Diagnosis not present

## 2016-03-15 MED ORDER — PREDNISONE 10 MG (21) PO TBPK: 10.0000 mg | ORAL_TABLET | Freq: Every day | ORAL | Status: DC

## 2016-03-15 MED ORDER — CYCLOBENZAPRINE HCL 5 MG PO TABS: 5.0000 mg | ORAL_TABLET | Freq: Three times a day (TID) | ORAL | Status: DC | PRN

## 2016-03-15 MED ORDER — ETODOLAC 300 MG PO CAPS: 300.0000 mg | ORAL_CAPSULE | Freq: Three times a day (TID) | ORAL | Status: DC

## 2016-03-15 NOTE — Patient Instructions (Signed)
Sciatica With Rehab The sciatic nerve runs from the back down the leg and is responsible for sensation and control of the muscles in the back (posterior) side of the thigh, lower leg, and foot. Sciatica is a condition that is characterized by inflammation of this nerve.  SYMPTOMS   Signs of nerve damage, including numbness and/or weakness along the posterior side of the lower extremity.  Pain in the back of the thigh that may also travel down the leg.  Pain that worsens when sitting for long periods of time.  Occasionally, pain in the back or buttock. CAUSES  Inflammation of the sciatic nerve is the cause of sciatica. The inflammation is due to something irritating the nerve. Common sources of irritation include:  Sitting for long periods of time.  Direct trauma to the nerve.  Arthritis of the spine.  Herniated or ruptured disk.  Slipping of the vertebrae (spondylolisthesis).  Pressure from soft tissues, such as muscles or ligament-like tissue (fascia). RISK INCREASES WITH:  Sports that place pressure or stress on the spine (football or weightlifting).  Poor strength and flexibility.  Failure to warm up properly before activity.  Family history of low back pain or disk disorders.  Previous back injury or surgery.  Poor body mechanics, especially when lifting, or poor posture. PREVENTION   Warm up and stretch properly before activity.  Maintain physical fitness:  Strength, flexibility, and endurance.  Cardiovascular fitness.  Learn and use proper technique, especially with posture and lifting. When possible, have coach correct improper technique.  Avoid activities that place stress on the spine. PROGNOSIS If treated properly, then sciatica usually resolves within 6 weeks. However, occasionally surgery is necessary.  RELATED COMPLICATIONS   Permanent nerve damage, including pain, numbness, tingle, or weakness.  Chronic back pain.  Risks of surgery: infection,  bleeding, nerve damage, or damage to surrounding tissues. TREATMENT Treatment initially involves resting from any activities that aggravate your symptoms. The use of ice and medication may help reduce pain and inflammation. The use of strengthening and stretching exercises may help reduce pain with activity. These exercises may be performed at home or with referral to a therapist. A therapist may recommend further treatments, such as transcutaneous electronic nerve stimulation (TENS) or ultrasound. Your caregiver may recommend corticosteroid injections to help reduce inflammation of the sciatic nerve. If symptoms persist despite non-surgical (conservative) treatment, then surgery may be recommended. MEDICATION  If pain medication is necessary, then nonsteroidal anti-inflammatory medications, such as aspirin and ibuprofen, or other minor pain relievers, such as acetaminophen, are often recommended.  Do not take pain medication for 7 days before surgery.  Prescription pain relievers may be given if deemed necessary by your caregiver. Use only as directed and only as much as you need.  Ointments applied to the skin may be helpful.  Corticosteroid injections may be given by your caregiver. These injections should be reserved for the most serious cases, because they may only be given a certain number of times. HEAT AND COLD  Cold treatment (icing) relieves pain and reduces inflammation. Cold treatment should be applied for 10 to 15 minutes every 2 to 3 hours for inflammation and pain and immediately after any activity that aggravates your symptoms. Use ice packs or massage the area with a piece of ice (ice massage).  Heat treatment may be used prior to performing the stretching and strengthening activities prescribed by your caregiver, physical therapist, or athletic trainer. Use a heat pack or soak the injury in warm water.   SEEK MEDICAL CARE IF:  Treatment seems to offer no benefit, or the condition  worsens.  Any medications produce adverse side effects. EXERCISES  RANGE OF MOTION (ROM) AND STRETCHING EXERCISES - Sciatica Most people with sciatic will find that their symptoms worsen with either excessive bending forward (flexion) or arching at the low back (extension). The exercises which will help resolve your symptoms will focus on the opposite motion. Your physician, physical therapist or athletic trainer will help you determine which exercises will be most helpful to resolve your low back pain. Do not complete any exercises without first consulting with your clinician. Discontinue any exercises which worsen your symptoms until you speak to your clinician. If you have pain, numbness or tingling which travels down into your buttocks, leg or foot, the goal of the therapy is for these symptoms to move closer to your back and eventually resolve. Occasionally, these leg symptoms will get better, but your low back pain may worsen; this is typically an indication of progress in your rehabilitation. Be certain to be very alert to any changes in your symptoms and the activities in which you participated in the 24 hours prior to the change. Sharing this information with your clinician will allow him/her to most efficiently treat your condition. These exercises may help you when beginning to rehabilitate your injury. Your symptoms may resolve with or without further involvement from your physician, physical therapist or athletic trainer. While completing these exercises, remember:   Restoring tissue flexibility helps normal motion to return to the joints. This allows healthier, less painful movement and activity.  An effective stretch should be held for at least 30 seconds.  A stretch should never be painful. You should only feel a gentle lengthening or release in the stretched tissue. FLEXION RANGE OF MOTION AND STRETCHING EXERCISES: STRETCH - Flexion, Single Knee to Chest   Lie on a firm bed or floor  with both legs extended in front of you.  Keeping one leg in contact with the floor, bring your opposite knee to your chest. Hold your leg in place by either grabbing behind your thigh or at your knee.  Pull until you feel a gentle stretch in your low back. Hold __________ seconds.  Slowly release your grasp and repeat the exercise with the opposite side. Repeat __________ times. Complete this exercise __________ times per day.  STRETCH - Flexion, Double Knee to Chest  Lie on a firm bed or floor with both legs extended in front of you.  Keeping one leg in contact with the floor, bring your opposite knee to your chest.  Tense your stomach muscles to support your back and then lift your other knee to your chest. Hold your legs in place by either grabbing behind your thighs or at your knees.  Pull both knees toward your chest until you feel a gentle stretch in your low back. Hold __________ seconds.  Tense your stomach muscles and slowly return one leg at a time to the floor. Repeat __________ times. Complete this exercise __________ times per day.  STRETCH - Low Trunk Rotation   Lie on a firm bed or floor. Keeping your legs in front of you, bend your knees so they are both pointed toward the ceiling and your feet are flat on the floor.  Extend your arms out to the side. This will stabilize your upper body by keeping your shoulders in contact with the floor.  Gently and slowly drop both knees together to one side until   you feel a gentle stretch in your low back. Hold for __________ seconds.  Tense your stomach muscles to support your low back as you bring your knees back to the starting position. Repeat the exercise to the other side. Repeat __________ times. Complete this exercise __________ times per day  EXTENSION RANGE OF MOTION AND FLEXIBILITY EXERCISES: STRETCH - Extension, Prone on Elbows  Lie on your stomach on the floor, a bed will be too soft. Place your palms about shoulder  width apart and at the height of your head.  Place your elbows under your shoulders. If this is too painful, stack pillows under your chest.  Allow your body to relax so that your hips drop lower and make contact more completely with the floor.  Hold this position for __________ seconds.  Slowly return to lying flat on the floor. Repeat __________ times. Complete this exercise __________ times per day.  RANGE OF MOTION - Extension, Prone Press Ups  Lie on your stomach on the floor, a bed will be too soft. Place your palms about shoulder width apart and at the height of your head.  Keeping your back as relaxed as possible, slowly straighten your elbows while keeping your hips on the floor. You may adjust the placement of your hands to maximize your comfort. As you gain motion, your hands will come more underneath your shoulders.  Hold this position __________ seconds.  Slowly return to lying flat on the floor. Repeat __________ times. Complete this exercise __________ times per day.  STRENGTHENING EXERCISES - Sciatica  These exercises may help you when beginning to rehabilitate your injury. These exercises should be done near your "sweet spot." This is the neutral, low-back arch, somewhere between fully rounded and fully arched, that is your least painful position. When performed in this safe range of motion, these exercises can be used for people who have either a flexion or extension based injury. These exercises may resolve your symptoms with or without further involvement from your physician, physical therapist or athletic trainer. While completing these exercises, remember:   Muscles can gain both the endurance and the strength needed for everyday activities through controlled exercises.  Complete these exercises as instructed by your physician, physical therapist or athletic trainer. Progress with the resistance and repetition exercises only as your caregiver advises.  You may  experience muscle soreness or fatigue, but the pain or discomfort you are trying to eliminate should never worsen during these exercises. If this pain does worsen, stop and make certain you are following the directions exactly. If the pain is still present after adjustments, discontinue the exercise until you can discuss the trouble with your clinician. STRENGTHENING - Deep Abdominals, Pelvic Tilt   Lie on a firm bed or floor. Keeping your legs in front of you, bend your knees so they are both pointed toward the ceiling and your feet are flat on the floor.  Tense your lower abdominal muscles to press your low back into the floor. This motion will rotate your pelvis so that your tail bone is scooping upwards rather than pointing at your feet or into the floor.  With a gentle tension and even breathing, hold this position for __________ seconds. Repeat __________ times. Complete this exercise __________ times per day.  STRENGTHENING - Abdominals, Crunches   Lie on a firm bed or floor. Keeping your legs in front of you, bend your knees so they are both pointed toward the ceiling and your feet are flat on the   floor. Cross your arms over your chest.  Slightly tip your chin down without bending your neck.  Tense your abdominals and slowly lift your trunk high enough to just clear your shoulder blades. Lifting higher can put excessive stress on the low back and does not further strengthen your abdominal muscles.  Control your return to the starting position. Repeat __________ times. Complete this exercise __________ times per day.  STRENGTHENING - Quadruped, Opposite UE/LE Lift  Assume a hands and knees position on a firm surface. Keep your hands under your shoulders and your knees under your hips. You may place padding under your knees for comfort.  Find your neutral spine and gently tense your abdominal muscles so that you can maintain this position. Your shoulders and hips should form a rectangle  that is parallel with the floor and is not twisted.  Keeping your trunk steady, lift your right hand no higher than your shoulder and then your left leg no higher than your hip. Make sure you are not holding your breath. Hold this position __________ seconds.  Continuing to keep your abdominal muscles tense and your back steady, slowly return to your starting position. Repeat with the opposite arm and leg. Repeat __________ times. Complete this exercise __________ times per day.  STRENGTHENING - Abdominals and Quadriceps, Straight Leg Raise   Lie on a firm bed or floor with both legs extended in front of you.  Keeping one leg in contact with the floor, bend the other knee so that your foot can rest flat on the floor.  Find your neutral spine, and tense your abdominal muscles to maintain your spinal position throughout the exercise.  Slowly lift your straight leg off the floor about 6 inches for a count of 15, making sure to not hold your breath.  Still keeping your neutral spine, slowly lower your leg all the way to the floor. Repeat this exercise with each leg __________ times. Complete this exercise __________ times per day. POSTURE AND BODY MECHANICS CONSIDERATIONS - Sciatica Keeping correct posture when sitting, standing or completing your activities will reduce the stress put on different body tissues, allowing injured tissues a chance to heal and limiting painful experiences. The following are general guidelines for improved posture. Your physician or physical therapist will provide you with any instructions specific to your needs. While reading these guidelines, remember:  The exercises prescribed by your provider will help you have the flexibility and strength to maintain correct postures.  The correct posture provides the optimal environment for your joints to work. All of your joints have less wear and tear when properly supported by a spine with good posture. This means you will  experience a healthier, less painful body.  Correct posture must be practiced with all of your activities, especially prolonged sitting and standing. Correct posture is as important when doing repetitive low-stress activities (typing) as it is when doing a single heavy-load activity (lifting). RESTING POSITIONS Consider which positions are most painful for you when choosing a resting position. If you have pain with flexion-based activities (sitting, bending, stooping, squatting), choose a position that allows you to rest in a less flexed posture. You would want to avoid curling into a fetal position on your side. If your pain worsens with extension-based activities (prolonged standing, working overhead), avoid resting in an extended position such as sleeping on your stomach. Most people will find more comfort when they rest with their spine in a more neutral position, neither too rounded nor too   arched. Lying on a non-sagging bed on your side with a pillow between your knees, or on your back with a pillow under your knees will often provide some relief. Keep in mind, being in any one position for a prolonged period of time, no matter how correct your posture, can still lead to stiffness. PROPER SITTING POSTURE In order to minimize stress and discomfort on your spine, you must sit with correct posture Sitting with good posture should be effortless for a healthy body. Returning to good posture is a gradual process. Many people can work toward this most comfortably by using various supports until they have the flexibility and strength to maintain this posture on their own. When sitting with proper posture, your ears will fall over your shoulders and your shoulders will fall over your hips. You should use the back of the chair to support your upper back. Your low back will be in a neutral position, just slightly arched. You may place a small pillow or folded towel at the base of your low back for support.  When  working at a desk, create an environment that supports good, upright posture. Without extra support, muscles fatigue and lead to excessive strain on joints and other tissues. Keep these recommendations in mind: CHAIR:   A chair should be able to slide under your desk when your back makes contact with the back of the chair. This allows you to work closely.  The chair's height should allow your eyes to be level with the upper part of your monitor and your hands to be slightly lower than your elbows. BODY POSITION  Your feet should make contact with the floor. If this is not possible, use a foot rest.  Keep your ears over your shoulders. This will reduce stress on your neck and low back. INCORRECT SITTING POSTURES   If you are feeling tired and unable to assume a healthy sitting posture, do not slouch or slump. This puts excessive strain on your back tissues, causing more damage and pain. Healthier options include:  Using more support, like a lumbar pillow.  Switching tasks to something that requires you to be upright or walking.  Talking a brief walk.  Lying down to rest in a neutral-spine position. PROLONGED STANDING WHILE SLIGHTLY LEANING FORWARD  When completing a task that requires you to lean forward while standing in one place for a long time, place either foot up on a stationary 2-4 inch high object to help maintain the best posture. When both feet are on the ground, the low back tends to lose its slight inward curve. If this curve flattens (or becomes too large), then the back and your other joints will experience too much stress, fatigue more quickly and can cause pain.  CORRECT STANDING POSTURES Proper standing posture should be assumed with all daily activities, even if they only take a few moments, like when brushing your teeth. As in sitting, your ears should fall over your shoulders and your shoulders should fall over your hips. You should keep a slight tension in your abdominal  muscles to brace your spine. Your tailbone should point down to the ground, not behind your body, resulting in an over-extended swayback posture.  INCORRECT STANDING POSTURES  Common incorrect standing postures include a forward head, locked knees and/or an excessive swayback. WALKING Walk with an upright posture. Your ears, shoulders and hips should all line-up. PROLONGED ACTIVITY IN A FLEXED POSITION When completing a task that requires you to bend forward   at your waist or lean over a low surface, try to find a way to stabilize 3 of 4 of your limbs. You can place a hand or elbow on your thigh or rest a knee on the surface you are reaching across. This will provide you more stability so that your muscles do not fatigue as quickly. By keeping your knees relaxed, or slightly bent, you will also reduce stress across your low back. CORRECT LIFTING TECHNIQUES DO :   Assume a wide stance. This will provide you more stability and the opportunity to get as close as possible to the object which you are lifting.  Tense your abdominals to brace your spine; then bend at the knees and hips. Keeping your back locked in a neutral-spine position, lift using your leg muscles. Lift with your legs, keeping your back straight.  Test the weight of unknown objects before attempting to lift them.  Try to keep your elbows locked down at your sides in order get the best strength from your shoulders when carrying an object.  Always ask for help when lifting heavy or awkward objects. INCORRECT LIFTING TECHNIQUES DO NOT:   Lock your knees when lifting, even if it is a small object.  Bend and twist. Pivot at your feet or move your feet when needing to change directions.  Assume that you cannot safely pick up a paperclip without proper posture.   This information is not intended to replace advice given to you by your health care provider. Make sure you discuss any questions you have with your health care provider.     Document Released: 10/07/2005 Document Revised: 02/21/2015 Document Reviewed: 01/19/2009 [employer] Interactive Patient Education 2016 [employer]  

## 2016-03-15 NOTE — Progress Notes (Addendum)
   Subjective:    Patient ID: Krystal Mckee, female    DOB: 05-14-1976, 40 y.o.   MRN: [id number]  Back Pain This is a chronic problem. The current episode started more than 1 year ago. The problem occurs constantly. The problem is unchanged. The pain is present in the gluteal. The quality of the pain is described as aching. The pain radiates to the right thigh. The pain is at a severity of 10/10. The pain is moderate. The symptoms are aggravated by bending and stress. Associated symptoms include leg pain, numbness, tingling and weakness. Pertinent negatives include no bladder incontinence or bowel incontinence. She has tried bed rest and ice for the symptoms. The treatment provided mild relief.      Review of Systems  Gastrointestinal: Negative for bowel incontinence.  Genitourinary: Negative for bladder incontinence.  Musculoskeletal: Positive for back pain.  Neurological: Positive for tingling, weakness and numbness.  All other systems reviewed and are negative.      Objective:   Physical Exam  Constitutional: She is oriented to person, place, and time. She appears well-developed and well-nourished. No distress.  Eyes: Pupils are equal, round, and reactive to light.  Neck: Normal range of motion. Neck supple. No thyromegaly present.  Cardiovascular: Normal rate, regular rhythm, normal heart sounds and intact distal pulses.   No murmur heard. Pulmonary/Chest: Effort normal and breath sounds normal. No respiratory distress. She has no wheezes.  Abdominal: Soft. Bowel sounds are normal. She exhibits no distension. There is no tenderness.  Musculoskeletal: Normal range of motion. She exhibits no edema or tenderness.  Neurological: She is alert and oriented to person, place, and time. She has normal reflexes. No cranial nerve deficit.  Skin: Skin is warm and dry.  Psychiatric: She has a normal mood and affect. Her behavior is normal. Judgment and thought content normal.  Vitals  reviewed.   Lumbar x-ray- WNL, Preliminary reading by Jannifer Rodneyhristy Caio Devera, FNP [facility]   BP 101/65 mmHg  Pulse 75  Temp(Src) 98 F (36.7 C)  Ht 5\' 4"  (1.626 m)  Wt 193 lb 3.2 oz (87.635 kg)  BMI 33.15 kg/m2     Assessment & Plan:  1. Right-sided low back pain with right-sided sciatica -Rest -ice and heat -ROM exercises discussed -RTO in 2-4 weeks, if not improved will refer - DG Lumbar Spine 2-3 Views; Future - predniSONE (STERAPRED UNI-PAK 21 TAB) 10 MG (21) TBPK tablet; Take 1 tablet (10 mg total) by mouth daily. As directed x 6 days  Dispense: 21 tablet; Refill: 0 - etodolac (LODINE) 300 MG capsule; Take 1 capsule (300 mg total) by mouth every 8 (eight) hours.  Dispense: 60 capsule; Refill: 3 - cyclobenzaprine (FLEXERIL) 5 MG tablet; Take 1 tablet (5 mg total) by mouth 3 (three) times daily as needed for muscle spasms.  Dispense: 30 tablet; Refill: 0  2. Obesity (BMI 30-39.9) - DG Lumbar Spine 2-3 Views; Future  Jannifer Rodneyhristy Derec Mozingo, FNP

## 2016-03-15 NOTE — Addendum Note (Signed)
Addended by: Jannifer RodneyHAWKS, Cindy Brindisi A on: 03/15/2016 09:15 AM   Modules accepted: Level of Service

## 2016-04-05 ENCOUNTER — Other Ambulatory Visit: Payer: Self-pay | Admitting: Pediatrics

## 2016-07-01 ENCOUNTER — Telehealth: Payer: Self-pay | Admitting: Family

## 2016-07-01 ENCOUNTER — Other Ambulatory Visit: Payer: Self-pay | Admitting: Family

## 2016-07-01 NOTE — Telephone Encounter (Signed)
error 

## 2016-08-09 ENCOUNTER — Other Ambulatory Visit: Payer: Self-pay | Admitting: Family

## 2016-08-09 ENCOUNTER — Telehealth: Payer: Self-pay | Admitting: Family

## 2016-08-12 MED ORDER — PAROXETINE HCL 40 MG PO TABS: 40.0000 mg | ORAL_TABLET | Freq: Every morning | ORAL | 0 refills | Status: DC

## 2016-08-12 NOTE — Telephone Encounter (Signed)
I refilled Paxil, but unsure what other medication needed to be refilled?

## 2016-08-13 ENCOUNTER — Encounter (HOSPITAL_COMMUNITY): Payer: Self-pay | Admitting: Emergency Medicine

## 2016-08-13 ENCOUNTER — Emergency Department (HOSPITAL_COMMUNITY)
Admission: EM | Admit: 2016-08-13 | Discharge: 2016-08-14 | Disposition: A | Discharge status: Home / Self Care | Payer: BC Managed Care – PPO | Attending: Emergency Medicine | Admitting: Emergency Medicine

## 2016-08-13 ENCOUNTER — Emergency Department (HOSPITAL_COMMUNITY): Payer: BC Managed Care – PPO

## 2016-08-13 DIAGNOSIS — E039 Hypothyroidism, unspecified: Secondary | ICD-10-CM | POA: Insufficient documentation

## 2016-08-13 DIAGNOSIS — N3090 Cystitis, unspecified without hematuria: Secondary | ICD-10-CM | POA: Diagnosis not present

## 2016-08-13 DIAGNOSIS — F17210 Nicotine dependence, cigarettes, uncomplicated: Secondary | ICD-10-CM | POA: Diagnosis not present

## 2016-08-13 DIAGNOSIS — R109 Unspecified abdominal pain: Secondary | ICD-10-CM | POA: Diagnosis present

## 2016-08-13 LAB — CBC WITH DIFFERENTIAL/PLATELET
Basophils Absolute: 0 10*3/uL (ref 0.0–0.1)
Basophils Relative: 0 %
Eosinophils Absolute: 0.2 10*3/uL (ref 0.0–0.7)
Eosinophils Relative: 2 %
HEMATOCRIT: 41.8 % (ref 36.0–46.0)
HEMOGLOBIN: 14.3 g/dL (ref 12.0–15.0)
LYMPHS ABS: 2.4 10*3/uL (ref 0.7–4.0)
LYMPHS PCT: 30 %
MCH: 31.4 pg (ref 26.0–34.0)
MCHC: 34.2 g/dL (ref 30.0–36.0)
MCV: 91.9 fL (ref 78.0–100.0)
MONOS PCT: 6 %
Monocytes Absolute: 0.5 10*3/uL (ref 0.1–1.0)
NEUTROS PCT: 62 %
Neutro Abs: 4.9 10*3/uL (ref 1.7–7.7)
Platelets: 180 10*3/uL (ref 150–400)
RBC: 4.55 MIL/uL (ref 3.87–5.11)
RDW: 12.8 % (ref 11.5–15.5)
WBC: 8 10*3/uL (ref 4.0–10.5)

## 2016-08-13 LAB — COMPREHENSIVE METABOLIC PANEL
ALK PHOS: 57 U/L (ref 38–126)
ALT: 21 U/L (ref 14–54)
AST: 17 U/L (ref 15–41)
Albumin: 4.4 g/dL (ref 3.5–5.0)
Anion gap: 7 (ref 5–15)
BILIRUBIN TOTAL: 1.1 mg/dL (ref 0.3–1.2)
BUN: 9 mg/dL (ref 6–20)
CALCIUM: 9.4 mg/dL (ref 8.9–10.3)
CO2: 27 mmol/L (ref 22–32)
CREATININE: 0.77 mg/dL (ref 0.44–1.00)
Chloride: 101 mmol/L (ref 101–111)
Glucose, Bld: 85 mg/dL (ref 65–99)
Potassium: 3.8 mmol/L (ref 3.5–5.1)
Sodium: 135 mmol/L (ref 135–145)
TOTAL PROTEIN: 7.7 g/dL (ref 6.5–8.1)

## 2016-08-13 LAB — LIPASE, BLOOD: LIPASE: 22 U/L (ref 11–51)

## 2016-08-13 LAB — URINALYSIS, ROUTINE W REFLEX MICROSCOPIC
Bilirubin Urine: NEGATIVE
GLUCOSE, UA: NEGATIVE mg/dL
HGB URINE DIPSTICK: NEGATIVE
Ketones, ur: NEGATIVE mg/dL
Nitrite: NEGATIVE
PROTEIN: NEGATIVE mg/dL
Specific Gravity, Urine: 1.015 (ref 1.005–1.030)
pH: 5.5 (ref 5.0–8.0)

## 2016-08-13 LAB — URINE MICROSCOPIC-ADD ON: RBC / HPF: NONE SEEN RBC/hpf (ref 0–5)

## 2016-08-13 LAB — LACTIC ACID, PLASMA: LACTIC ACID, VENOUS: 1 mmol/L (ref 0.5–1.9)

## 2016-08-13 MED ORDER — IOPAMIDOL (ISOVUE-300) INJECTION 61%
INTRAVENOUS | Status: AC
  Filled 2016-08-13: qty 50

## 2016-08-13 MED ORDER — MORPHINE SULFATE (PF) 4 MG/ML IV SOLN
4.0000 mg | Freq: Once | INTRAVENOUS | Status: AC
  Administered 2016-08-13: 4 mg via INTRAVENOUS
  Filled 2016-08-13: qty 1

## 2016-08-13 MED ORDER — IOPAMIDOL (ISOVUE-300) INJECTION 61%
100.0000 mL | Freq: Once | INTRAVENOUS | Status: AC | PRN
  Administered 2016-08-13: 100 mL via INTRAVENOUS

## 2016-08-13 MED ORDER — HYDROCODONE-ACETAMINOPHEN 5-325 MG PO TABS: 1.0000 | ORAL_TABLET | Freq: Four times a day (QID) | ORAL | 0 refills | Status: DC | PRN

## 2016-08-13 MED ORDER — SODIUM CHLORIDE 0.9 % IV BOLUS (SEPSIS)
1000.0000 mL | Freq: Once | INTRAVENOUS | Status: AC
  Administered 2016-08-13: 1000 mL via INTRAVENOUS

## 2016-08-13 MED ORDER — SULFAMETHOXAZOLE-TRIMETHOPRIM 800-160 MG PO TABS: 1.0000 | ORAL_TABLET | Freq: Two times a day (BID) | ORAL | 0 refills | Status: AC

## 2016-08-13 NOTE — ED Provider Notes (Signed)
[facility]-EMER[facility]ENCY DEPT Provider Note   CSN: [id number] Arrival date & time: 08/13/16  1901   By signing my name below, I, Nelwyn Salisbury, attest that this documentation has been prepared under the direction and in the presence of Heide Scales, MD . Electronically Signed: Nelwyn Salisbury, Scribe. 08/13/2016. 8:17 PM.  History   Chief Complaint Chief Complaint  Patient presents with  . Flank Pain   The history is provided by the patient. No language interpreter was used.  Flank Pain  This is a new problem. The current episode started more than 1 week ago. The problem occurs constantly. The problem has been gradually worsening. Associated symptoms include abdominal pain. The symptoms are aggravated by coughing, twisting and bending. The symptoms are relieved by position.    HPI Comments:  BRITENY FULGHUM is a 40 y.o. female who presents to the Emergency Department complaining of sudden-onset constant gradually worsening flank pain beginning about 7 days ago. She describes her flank pain as an 8/10 cramping pain that is worse on her right side, exacerbated by coughing or palpation. Pt describes her central abdominal pain as a "soreness, dont-touch-me pain". Her pain is exacerbated/relieved by certain positions. She starts that her symptoms started as a sensation of having to pee, and progressed to pain. Pt reports associated frequency. She denies any nausea, vomiting, diarrhea, constipation, vaginal discharge, leg swelling , fevers, chills, or appetite change. She has an abdominal shx of partial hysterectomy.   Past Medical History:  Diagnosis Date  . Anxiety   . Colon polyps   . Depression   . Hypothyroidism     Patient Active Problem List   Diagnosis Date Noted  . Obesity (BMI 30-39.9) 03/15/2016  . Hyperlipidemia 08/31/2015  . Vitamin D deficiency 02/07/2015  . Hypothyroidism 05/31/2014  . Depression 05/31/2014  . GAD (generalized anxiety disorder) 05/31/2014  . Syncope  02/18/2013  . Dyspareunia 01/12/2013  . IBS (irritable colon syndrome) 01/12/2013    Past Surgical History:  Procedure Laterality Date  . ANTERIOR AND POSTERIOR REPAIR    . CESAREAN SECTION    . COLONOSCOPY  01/18/13  . CYST EXCISION     Right wrist  . CYST EXCISION     Left knee  . PARTIAL HYSTERECTOMY    . TONSILLECTOMY AND ADENOIDECTOMY      OB History    Gravida Para Term Preterm AB Living   2 2 2  0 0 2   SAB TAB Ectopic Multiple Live Births   0 0 0 0 2       Home Medications    Prior to Admission medications   Medication Sig Start Date End Date Taking? Authorizing Provider  atorvastatin (LIPITOR) 20 MG tablet Take 1 tablet (20 mg total) by mouth daily. Patient not taking: Reported on 03/15/2016 08/31/15   Junie Spencer, FNP  cyclobenzaprine (FLEXERIL) 5 MG tablet Take 1 tablet (5 mg total) by mouth 3 (three) times daily as needed for muscle spasms. 03/15/16   Junie Spencer, FNP  etodolac (LODINE) 300 MG capsule Take 1 capsule (300 mg total) by mouth every 8 (eight) hours. 03/15/16   Junie Spencer, FNP  fluticasone (FLONASE) 50 MCG/ACT nasal spray Place 2 sprays into both nostrils daily. 08/29/15   Junie Spencer, FNP  levothyroxine (SYNTHROID, LEVOTHROID) 88 MCG tablet TAKE ONE TABLET BY MOUTH ONCE DAILY 03/05/16   Junie Spencer, FNP  PARoxetine (PAXIL) 40 MG tablet Take 1 tablet (40 mg total) by mouth every morning.  08/12/16   Junie Spencerhristy A Hawks, FNP  predniSONE (STERAPRED UNI-PAK 21 TAB) 10 MG (21) TBPK tablet Take 1 tablet (10 mg total) by mouth daily. As directed x 6 days 03/15/16   Junie Spencerhristy A Hawks, FNP  traZODone (DESYREL) 50 MG tablet Take 3 tablets (150 mg total) by mouth at bedtime as needed for sleep. 12/13/15   Johna Sheriffarol L Vincent, MD  Vitamin D, Ergocalciferol, (DRISDOL) 50000 UNITS CAPS capsule Take 1 capsule (50,000 Units total) by mouth every 7 (seven) days. 08/31/15   Junie Spencerhristy A Hawks, FNP    Family History Family History  Problem Relation Age of Onset  .  Colon polyps Father   . Ovarian cancer Mother   . Diabetes Maternal Grandmother     Social History Social History  Substance Use Topics  . Smoking status: Current Some Day Smoker    Packs/day: 0.25    Types: Cigarettes  . Smokeless tobacco: Never Used  . Alcohol use No     Allergies   Penicillins and Zoloft [sertraline hcl]   Review of Systems Review of Systems  Constitutional: Negative for appetite change, chills and fever.  Cardiovascular: Negative for leg swelling.  Gastrointestinal: Positive for abdominal pain. Negative for constipation, diarrhea and vomiting.  Genitourinary: Positive for flank pain, frequency and urgency. Negative for vaginal discharge.  All other systems reviewed and are negative.    Physical Exam Updated Vital Signs BP 135/81 (BP Location: Right Arm)   Pulse 87   Temp 97.7 F (36.5 C) (Oral)   Resp 16   Ht 5\' 4"  (1.626 m)   Wt 191 lb 8 oz (86.9 kg)   SpO2 100%   BMI 32.87 kg/m   Physical Exam  Constitutional: She appears well-developed and well-nourished. No distress.  HENT:  Head: Normocephalic and atraumatic.  Eyes: Conjunctivae are normal.  Neck: Neck supple.  Cardiovascular: Normal rate and regular rhythm.   No murmur heard. Pulmonary/Chest: Effort normal and breath sounds normal. No respiratory distress.  Abdominal: Soft. She exhibits no distension. There is tenderness. There is no guarding.  Tenderness across lower abdomen.   Musculoskeletal: She exhibits no edema.  Neurological: She is alert.  Skin: Skin is warm and dry.  Psychiatric: She has a normal mood and affect.  Nursing note and vitals reviewed.    ED Treatments / Results  DIAGNOSTIC STUDIES:  Oxygen Saturation is 100% on RA, normal by my interpretation.    COORDINATION OF CARE:  8:43 PM Discussed treatment plan with pt at bedside which includes labwork and imaging and pt agreed to plan.  Labs (all labs ordered are listed, but only abnormal results are  displayed) Labs Reviewed  URINALYSIS, ROUTINE W REFLEX MICROSCOPIC (NOT AT [facility]) - Abnormal; Notable for the following:       Result Value   Leukocytes, UA TRACE (*)    All other components within normal limits  URINE MICROSCOPIC-ADD ON - Abnormal; Notable for the following:    Squamous Epithelial / LPF 0-5 (*)    Bacteria, UA MANY (*)    All other components within normal limits  URINE CULTURE  CBC WITH DIFFERENTIAL/PLATELET  COMPREHENSIVE METABOLIC PANEL  LACTIC ACID, PLASMA  LIPASE, BLOOD    EKG  EKG Interpretation None       Radiology Ct Abdomen Pelvis W Contrast  Result Date: 08/13/2016 CLINICAL DATA:  Sudden onset flank pain worse by coughing or palpation EXAM: CT ABDOMEN AND PELVIS WITH CONTRAST TECHNIQUE: Multidetector CT imaging of the abdomen and pelvis was  performed using the standard protocol following bolus administration of intravenous contrast. CONTRAST:  ISOVUE-300 IOPAMIDOL (ISOVUE-300) INJECTION 61% COMPARISON:  None. FINDINGS: Lower chest: No acute abnormality. Hepatobiliary: No focal liver abnormality is seen. No gallstones, gallbladder wall thickening, or biliary dilatation. Pancreas: Unremarkable. No pancreatic ductal dilatation or surrounding inflammatory changes. Spleen: Normal in size without focal abnormality. Adrenals/Urinary Tract: Adrenal glands are unremarkable. Kidneys are normal, without renal calculi, focal lesion, or hydronephrosis. Bladder is slightly thick walled in appearance. Stomach/Bowel: Stomach is within normal limits. Appendix appears normal. No evidence of bowel wall thickening, distention, or inflammatory changes. Vascular/Lymphatic: No significant vascular findings are present. No enlarged abdominal or pelvic lymph nodes. Reproductive: Status post hysterectomy. No adnexal masses. Other: No abdominal wall hernia or abnormality. No abdominopelvic ascites. Musculoskeletal: No acute or significant osseous findings. IMPRESSION: 1. Mildly  thick-walled appearance of the urinary bladder, correlate clinically to exclude cystitis. 2. Otherwise negative CT examination of the abdomen and pelvis. Electronically Signed   By: Jasmine Pang M.D.   On: 08/13/2016 22:57    Procedures Procedures (including critical care time)  Medications Ordered in ED Medications  sodium chloride 0.9 % bolus 1,000 mL (0 mLs Intravenous Stopped 08/13/16 2309)  morphine 4 MG/ML injection 4 mg (4 mg Intravenous Given 08/13/16 2143)  iopamidol (ISOVUE-300) 61 % injection 100 mL (100 mLs Intravenous Contrast Given 08/13/16 2216)     Initial Impression / Assessment and Plan / ED Course  I have reviewed the triage vital signs and the nursing notes.  Pertinent labs & imaging results that were available during my care of the patient were reviewed by me and considered in my medical decision making (see chart for details).  Clinical Course    MELEAH DEMEYER is a 40 y.o. female who presents to the Emergency Department complaining of flank pain, central lower abdominal ache, and urinary frequency.   History and examination above.  Exam significant for very mild Abdominal tenderness. No pelvic tenderness. No CVA tenderness. Normal lung exam, normal neurologic exam.   Diagnostic workup revealed evidence of UTI with some inflammatory changes suggestive of cystitis on CT. No appendicitis on imaging, no diverticulitis, no obstruction, and no pelvic pathology.  Patient felt better after nausea medications And pain medication. Do not feel patient has pyelonephritis based on lack of CVA tenderness or abnormalities on labs are imaging. Feel patient has cystitis.  Patient given prescription for antibiotics for UTI and Pain medications. Patient instructed to follow up with her PCP for further management. Patient given return precautions for any worsening symptoms.  Patient had no other questions or concerns and was discharged in good condition.  Final Clinical  Impressions(s) / ED Diagnoses   Final diagnoses:  Cystitis    New Prescriptions Discharge Medication List as of 08/13/2016 11:57 PM    START taking these medications   Details  HYDROcodone-acetaminophen (NORCO/VICODIN) 5-325 MG tablet Take 1 tablet by mouth every 6 (six) hours as needed., Starting Tue 08/13/2016, Print    sulfamethoxazole-trimethoprim (BACTRIM DS,SEPTRA DS) 800-160 MG tablet Take 1 tablet by mouth 2 (two) times daily., Starting Tue 08/13/2016, Until Fri 08/16/2016, Print       I personally performed the services described in this documentation, which was scribed in my presence. The recorded information has been reviewed and is accurate.  Clinical Impression: 1. Cystitis     Disposition: Discharge  Condition: Good  I have discussed the results, Dx and Tx plan with the pt(& family if present). He/she/they expressed understanding  and agree(s) with the plan. Discharge instructions discussed at great length. Strict return precautions discussed and pt &/or family have verbalized understanding of the instructions. No further questions at time of discharge.    Discharge Medication List as of 08/13/2016 11:57 PM    START taking these medications   Details  HYDROcodone-acetaminophen (NORCO/VICODIN) 5-325 MG tablet Take 1 tablet by mouth every 6 (six) hours as needed., Starting Tue 08/13/2016, Print    sulfamethoxazole-trimethoprim (BACTRIM DS,SEPTRA DS) 800-160 MG tablet Take 1 tablet by mouth 2 (two) times daily., Starting Tue 08/13/2016, Until Fri 08/16/2016, Print        Follow Up: No follow-up provider specified.     Heide Scales, MD 08/14/16 1335

## 2016-08-13 NOTE — ED Triage Notes (Signed)
Pt reports flank pain and groin pain that started 1 week ago. Pt states it started initially with urinary frequency and feeling like she had to empty her bladder even after she voided. Pt states the pain in her flank started over the weekend, has become constant yesterday. Pt denies hematuria, n/v or fever.

## 2016-08-16 LAB — URINE CULTURE

## 2016-08-17 ENCOUNTER — Telehealth (HOSPITAL_BASED_OUTPATIENT_CLINIC_OR_DEPARTMENT_OTHER): Payer: Self-pay | Admitting: *Deleted

## 2016-08-17 NOTE — Telephone Encounter (Signed)
Post ED Visit - Positive Culture Follow-up  Culture report reviewed by antimicrobial stewardship pharmacist:  []  Enzo BiNathan Batchelder, Pharm.D. []  Celedonio MiyamotoJeremy Frens, Pharm.D., BCPS []  Garvin FilaMike Maccia, Pharm.D. []  Georgina PillionElizabeth Martin, [address]Ph[address]D., BCPS []  [place] HeightsMinh Pham, [place]arm.D., BCPS, AAHIVP []  Estella HuskMichelle Turner, Pharm.D., BCPS, AAHIVP []  Tennis Mustassie Stewart, [address]Pharm.D. []  Sherle Poeob Vincent, [address]Ph[address] Jairo Benachel Rumberger, Pharm D  Positive Urine culture Treated with Sulfamethoxazole-Trimethoprim, organism sensitive to the same and no further patient follow-up is required at this time.  Virl AxeRobertson, [facility] 08/17/2016, 11:26 AM

## 2016-09-25 ENCOUNTER — Ambulatory Visit (INDEPENDENT_AMBULATORY_CARE_PROVIDER_SITE_OTHER): Payer: BC Managed Care – PPO | Admitting: Pediatrics

## 2016-09-25 ENCOUNTER — Encounter: Payer: Self-pay | Admitting: Pediatrics

## 2016-09-25 ENCOUNTER — Other Ambulatory Visit: Payer: Self-pay | Admitting: Family

## 2016-09-25 VITALS — BP 120/89 | HR 73 | Temp 97.5°F | Ht 64.0 in | Wt 191.2 lb

## 2016-09-25 DIAGNOSIS — E039 Hypothyroidism, unspecified: Secondary | ICD-10-CM

## 2016-09-25 DIAGNOSIS — Z23 Encounter for immunization: Secondary | ICD-10-CM

## 2016-09-25 DIAGNOSIS — F329 Major depressive disorder, single episode, unspecified: Secondary | ICD-10-CM

## 2016-09-25 DIAGNOSIS — F411 Generalized anxiety disorder: Secondary | ICD-10-CM

## 2016-09-25 DIAGNOSIS — H6502 Acute serous otitis media, left ear: Secondary | ICD-10-CM

## 2016-09-25 DIAGNOSIS — E559 Vitamin D deficiency, unspecified: Secondary | ICD-10-CM

## 2016-09-25 DIAGNOSIS — F32A Depression, unspecified: Secondary | ICD-10-CM

## 2016-09-25 MED ORDER — FLUTICASONE PROPIONATE 50 MCG/ACT NA SUSP: 2.0000 | Freq: Every day | NASAL | 6 refills | Status: DC

## 2016-09-25 MED ORDER — HYDROXYZINE HCL 25 MG PO TABS: 25.0000 mg | ORAL_TABLET | Freq: Three times a day (TID) | ORAL | 2 refills | Status: DC | PRN

## 2016-09-25 MED ORDER — LEVOTHYROXINE SODIUM 88 MCG PO TABS: 88.0000 ug | ORAL_TABLET | Freq: Every day | ORAL | 3 refills | Status: DC

## 2016-09-25 MED ORDER — PAROXETINE HCL 30 MG PO TABS: 60.0000 mg | ORAL_TABLET | Freq: Every morning | ORAL | 1 refills | Status: DC

## 2016-09-25 MED ORDER — AZITHROMYCIN 250 MG PO TABS: ORAL_TABLET | ORAL | 0 refills | Status: DC

## 2016-09-25 NOTE — Patient Instructions (Addendum)
Take flonase and cetirizine or similar over the counter allergy pill daily  Your provider wants you to schedule an appointment with a Psychologist/Psychiatrist. The following list of offices requires the patient to call and make their own appointment, as there is information they need that only you can provide. Please feel free to choose form the following providers:  [facility]   [phone] Crisis Recovery in [place] [phone]  [facility]  [phone]   [address] [place], [place]  (Scheduled through [facility]) Must call and do an interview for appointment. Sees Children / Accepts Medicaid  [facility]    [phone]  [address], [address]    [place], [place]       [facility]  [phone] [address] [place], [place]  Evaluates for Autism but does not treat it Sees Children / Accepts Medicaid  [facility]    [phone] [address], [address]   [place], [place] Medication management, substance abuse, bipolar, grief, family, marriage, OCD, anxiety, PTSD Sees children / Accepts Medicaid  [facility]    [phone] [address] [place], [place] Sees children / Accepts [facility]  [facility]  [phone] [address] [place], [place]   Dr Estelle GrumblesAkinlayo     [phone] [address] [place], [place]  Sees ADD & ADHD for treatment Accepts Medicaid  [facility]  [phone] [address] [place], [place] Evaluates for Autism Accepts [facility]  [facility]   [phone] [address]  [place], [place]  Does Adult ADD evaluations Does not accept Medicaid  [facility]   [phone] [address]   [place], [place] Uses animal therapy  Sees children as young as 41 years old Accepts Medicaid

## 2016-09-25 NOTE — Telephone Encounter (Signed)
Please review and advise.

## 2016-09-25 NOTE — Progress Notes (Addendum)
Subjective:   Patient ID: Krystal Mckee, female    DOB: 1975-10-28, 40 y.o.   MRN: [id number] CC: Follow-up (Medication Refill); Sore Throat; Headache; and Ear Pain  HPI: Krystal PilaMelinda K Langill is a 40 y.o. female presenting for Follow-up (Medication Refill); Sore Throat; Headache; and Ear Pain  Has been taking OTC sinus med Has had congestion, URI symptoms for 3 weeks Was coughing a lot at first now improved Continues to be bothered by ear pressure and pain No fevers now Was taking flonase regularly as well but recently ran out  Stopped trazodone bc made her feel congested Feels tired all the time  Lots of ongoing social stressors Son has court date for felony charges 11/08/2015 Daughter getting defibrillator, is in life vest now Pt has hard time shutting mind off at night Feels liek having to pretend to be 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 Feels safe at home Passive thoughts of not wanting to be here but family needs her Has someone at work she regularly talks with and is supportive Has someone to be with her at court date for son  Interested in counseling, lost her paper previously with counselors listed on it  Dep[facility] 09/25/2016 03/15/2016 12/13/2015 12/13/2015 08/29/2015  Decreased Interest 3 0 2 0 0  Down, Depressed, Hopeless 3 0 1 0 0  PHQ - 2 Score 6 0 3 0 0  Altered sleeping 2 - 3 - -  Tired, decreased energy 3 - 3 - -  Change in appetite 2 - 3 - -  Feeling bad or failure about yourself  3 - 2 - -  Trouble concentrating 3 - 1 - -  Moving slowly or fidgety/restless 2 - 1 - -  Suicidal thoughts 2 - 1 - -  PHQ-9 Score 23 - 17 - -  Difficult doing work/chores Very difficult - Somewhat difficult - -     Relevant past medical, surgical, family and social history reviewed. Allergies and medications reviewed and updated. History  Smoking Status  . Current Some Day Smoker  . Packs/day: 0.25  . Types: Cigarettes  Smokeless Tobacco  . Never  Used   ROS: Per HPI   Objective:    BP 120/89   Pulse 73   Temp 97.5 F (36.4 C) (Oral)   Ht 5\' 4"  (1.626 m)   Wt 191 lb 3.2 oz (86.7 kg)   BMI 32.82 kg/m   Wt Readings from Last 3 Encounters:  09/25/16 191 lb 3.2 oz (86.7 kg)  08/13/16 191 lb 8 oz (86.9 kg)  03/15/16 193 lb 3.2 oz (87.6 kg)    Gen: NAD, alert, cooperative with exam, NCAT EYES: EOMI, no conjunctival injection, or no icterus ENT:  TMs b/l with fluid, air bubbles present, L TM slightly red, OP with mild erythema LYMPH: small apprx 1 cm LAD b/l CV: NRRR, normal S1/S2, no murmur, distal pulses 2+ b/l Resp: CTABL, no wheezes, normal WOB Neuro: Alert and oriented, strength equal b/l UE and LE, coordination grossly normal  Assessment & Plan:  [name] was seen today for follow-up, sore throat, headache and ear pain.  Diagnoses and all orders for this visit:  GAD (generalized anxiety disorder) Ongoing symptoms Having hard time sleeping Increase paxil Call today for appt with counselor Let me know if anything is getting worse -     PARoxetine (PAXIL) 30 MG tablet; Take 2 tablets (60 mg total) by mouth every morning. -     hydrOXYzine (ATARAX/VISTARIL) 25  MG tablet; Take 1 tablet (25 mg total) by mouth 3 (three) times daily as needed.  Depression, unspecified depression type Ongoing sypmptoms, increasing paxil as above  Acute serous otitis media of left ear, recurrence not specified Cont flonase, antihistamine Treat with zpak -     azithromycin (ZITHROMAX) 250 MG tablet; Take 2 the first day and then one each day after. -     fluticasone (FLONASE) 50 MCG/ACT nasal spray; Place 2 sprays into both nostrils daily.  Hypothyroidism, unspecified type -     TSH -     levothyroxine (SYNTHROID, LEVOTHROID) 88 MCG tablet; Take 1 tablet (88 mcg total) by mouth daily.   Follow up plan: 8 weeks Rex Krasarol Taji Barretto, MD [facility]  ADDENDUM: Increase levothyroxine to 100mcg with TSH 7

## 2016-09-26 LAB — TSH: TSH: 7.08 u[IU]/mL — AB (ref 0.450–4.500)

## 2016-09-27 LAB — SPECIMEN STATUS REPORT

## 2016-09-27 LAB — VITAMIN D 25 HYDROXY (VIT D DEFICIENCY, FRACTURES): VIT D 25 HYDROXY: 22 ng/mL — AB (ref 30.0–100.0)

## 2016-09-27 MED ORDER — LEVOTHYROXINE SODIUM 100 MCG PO TABS: 100.0000 ug | ORAL_TABLET | Freq: Every day | ORAL | 3 refills | Status: DC

## 2016-09-27 NOTE — Addendum Note (Signed)
Addended by: [name], Burch Marchuk L on: 09/27/2016 05:13 PM   Modules accepted: Orders

## 2016-10-16 ENCOUNTER — Encounter: Payer: Self-pay | Admitting: Pediatrics

## 2017-03-25 ENCOUNTER — Telehealth: Payer: Self-pay | Admitting: Pediatrics

## 2017-03-25 NOTE — Telephone Encounter (Signed)
Returned pharmacist phone call.  Ok to use generic.

## 2017-04-30 ENCOUNTER — Other Ambulatory Visit: Payer: Self-pay | Admitting: Pediatrics

## 2017-04-30 DIAGNOSIS — F411 Generalized anxiety disorder: Secondary | ICD-10-CM

## 2017-07-31 ENCOUNTER — Ambulatory Visit: Payer: BC Managed Care – PPO | Admitting: Pediatrics

## 2017-08-01 ENCOUNTER — Encounter: Payer: Self-pay | Admitting: Pediatrics

## 2017-08-01 ENCOUNTER — Ambulatory Visit (INDEPENDENT_AMBULATORY_CARE_PROVIDER_SITE_OTHER): Payer: BC Managed Care – PPO | Admitting: Pediatrics

## 2017-08-01 VITALS — BP 111/82 | HR 78 | Temp 97.4°F | Ht 64.0 in | Wt 194.6 lb

## 2017-08-01 DIAGNOSIS — H6502 Acute serous otitis media, left ear: Secondary | ICD-10-CM

## 2017-08-01 DIAGNOSIS — R5383 Other fatigue: Secondary | ICD-10-CM | POA: Diagnosis not present

## 2017-08-01 DIAGNOSIS — F411 Generalized anxiety disorder: Secondary | ICD-10-CM

## 2017-08-01 DIAGNOSIS — E559 Vitamin D deficiency, unspecified: Secondary | ICD-10-CM

## 2017-08-01 DIAGNOSIS — E039 Hypothyroidism, unspecified: Secondary | ICD-10-CM | POA: Diagnosis not present

## 2017-08-01 DIAGNOSIS — M7061 Trochanteric bursitis, right hip: Secondary | ICD-10-CM

## 2017-08-01 MED ORDER — FLUTICASONE PROPIONATE 50 MCG/ACT NA SUSP: 2.0000 | Freq: Every day | NASAL | 6 refills | Status: DC

## 2017-08-01 MED ORDER — HYDROXYZINE HCL 25 MG PO TABS: 25.0000 mg | ORAL_TABLET | Freq: Three times a day (TID) | ORAL | 2 refills | Status: DC | PRN

## 2017-08-01 MED ORDER — LEVOTHYROXINE SODIUM 100 MCG PO TABS: 100.0000 ug | ORAL_TABLET | Freq: Every day | ORAL | 3 refills | Status: DC

## 2017-08-01 MED ORDER — PAROXETINE HCL 30 MG PO TABS: 60.0000 mg | ORAL_TABLET | Freq: Every morning | ORAL | 1 refills | Status: DC

## 2017-08-01 NOTE — Patient Instructions (Addendum)
Start ibuprofen  three times a day with food, take for the next few days then take as needed  Trochanteric Bursitis Rehab Ask your health care provider which exercises are safe for you. Do exercises exactly as told by your health care provider and adjust them as directed. It is normal to feel mild stretching, pulling, tightness, or discomfort as you do these exercises, but you should stop right away if you feel sudden pain or your pain gets worse.Do not begin these exercises until told by your health care provider. Stretching exercises These exercises warm up your muscles and joints and improve the movement and flexibility of your hip. These exercises also help to relieve pain and stiffness. Exercise A: Iliotibial band stretch  1. Lie on your side with your left / right leg in the top position. 2. Bend your left / right knee and grab your ankle. 3. Slowly bring your knee back so your thigh is behind your body. 4. Slowly lower your knee toward the floor until you feel a gentle stretch on the outside of your left / right thigh. If you do not feel a stretch and your knee will not fall farther, place the heel of your other foot on top of your outer knee and pull your thigh down farther. 5. Hold this position for __________ seconds. 6. Slowly return to the starting position. Repeat __________ times. Complete this exercise __________ times a day. Strengthening exercises These exercises build strength and endurance in your hip and pelvis. Endurance is the ability to use your muscles for a long time, even after they get tired. Exercise B: Bridge ( hip extensors) 1. Lie on your back on a firm surface with your knees bent and your feet flat on the floor. 2. Tighten your buttocks muscles and lift your buttocks off the floor until your trunk is level with your thighs. You should feel the muscles working in your buttocks and the back of your thighs. If this exercise is too easy, try doing it with your  arms crossed over your chest. 3. Hold this position for __________ seconds. 4. Slowly return to the starting position. 5. Let your muscles relax completely between repetitions. Repeat __________ times. Complete this exercise __________ times a day. Exercise C: Squats ( knee extensors and  quadriceps) 1. Stand in front of a table, with your feet and knees pointing straight ahead. You may rest your hands on the table for balance but not for support. 2. Slowly bend your knees and lower your hips like you are going to sit in a chair. ? Keep your weight over your heels, not over your toes. ? Keep your lower legs upright so they are parallel with the table legs. ? Do not let your hips go lower than your knees. ? Do not bend lower than told by your health care provider. ? If your hip pain increases, do not bend as low. 3. Hold this position for __________ seconds. 4. Slowly push with your legs to return to standing. Do not use your hands to pull yourself to standing. Repeat __________ times. Complete this exercise __________ times a day. Exercise D: Hip hike 1. Stand sideways on a bottom step. Stand on your left / right leg with your other foot unsupported next to the step. You can hold onto the railing or wall if needed for balance. 2. Keeping your knees straight and your torso square, lift your left / right hip up toward the ceiling. 3. Hold this position for  __________ seconds. 4. Slowly let your left / right hip lower toward the floor, past the starting position. Your foot should get closer to the floor. Do not lean or bend your knees. Repeat __________ times. Complete this exercise __________ times a day. Exercise E: Single leg stand 1. Stand near a counter or door frame that you can hold onto for balance as needed. It is helpful to stand in front of a mirror for this exercise so you can watch your hip. 2. Squeeze your left / right buttock muscles then lift up your other foot. Do not let your  left / right hip push out to the side. 3. Hold this position for __________ seconds. Repeat __________ times. Complete this exercise __________ times a day. This information is not intended to replace advice given to you by your health care provider. Make sure you discuss any questions you have with your health care provider. Document Released: 11/14/2004 Document Revised: 06/13/2016 Document Reviewed: 09/22/2015 Elsevier Interactive Patient Education  [employer].

## 2017-08-01 NOTE — Progress Notes (Signed)
  Subjective:   Patient ID: Krystal Mckee, female    DOB: 30-Dec-1975, 41 y.o.   MRN: [id number] CC: Follow-up and Joint Pain  HPI: Krystal Mckee is a 41 y.o. 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 Allergies and medications reviewed and updated. History  Smoking Status  . Current Some Day Smoker  . Packs/day: 0.25  . Types: Cigarettes  Smokeless Tobacco  . Never Used   ROS: Per HPI   Objective:    BP 111/82   Pulse 78   Temp (!) 97.4 F (36.3 C) (Oral)   Ht _0  (1.626 m)   Wt 194 lb 9.6 oz (88.3 kg)   BMI 33.40 kg/m   Wt Readings from Last 3 Encounters:  08/01/17 194 lb 9.6 oz (88.3 kg)  09/25/16 191 lb 3.2 oz (86.7 kg)  08/13/16 191 lb 8 oz (86.9 kg)    Gen: NAD, alert, cooperative with exam, NCAT EYES: EOMI, no conjunctival injection, or no icterus ENT:  TMs with clear effusion b/l, OP without erythema LYMPH: no cervical LAD CV: NRRR, normal S1/S2, no murmur, distal pulses 2+ b/l Resp: CTABL, no wheezes, normal WOB Abd: +BS, soft, NTND. Ext: No edema, warm Neuro: Alert and oriented, strength equal b/l UE and LE, coordination grossly normal MSK: ttp over R greater trochanter Normal ROM b/l hip with internal/external rotation Psych: full affect, no thoughts of self harm  Assessment & Plan:  Krystal Mckee was seen today for follow-up and joint pain.  Diagnoses and all  orders for this visit:  Fatigue, unspecified type -     BMP8+EGFR -     CBC with Differential/Platelet -     TSH -     Vitamin B12  Acute serous otitis media of left ear, recurrence not specified Cont below prn -     fluticasone (FLONASE) 50 MCG/ACT nasal spray; Place 2 sprays into both nostrils daily.  GAD (generalized anxiety disorder) Symptoms well contorlled Cont below Is having some trouble with weight lose Pt wants to cont current meds for now Will consider alternate meds in future -     hydrOXYzine (ATARAX/VISTARIL) 25 MG tablet; Take 1 tablet (25 mg total) by mouth 3 (three) times daily as needed. -     PARoxetine (PAXIL) 30 MG tablet; Take 2 tablets (60 mg total) by mouth every morning.  Hypothyroidism, unspecified type Stable, cont below -     levothyroxine (SYNTHROID, LEVOTHROID) 100 MCG tablet; Take 1 tablet (100 mcg total) by mouth daily.  Vitamin D deficiency -     VITAMIN D 25 Hydroxy (Vit-D Deficiency, Fractures)  Trochanteric bursitis of right hip NSAIDs prn with food Gave exercises/stretching  Follow up plan: Return in about 3 months (around 11/01/2017). Assunta Found, MD [place]

## 2017-08-03 LAB — BMP8+EGFR
BUN/Creatinine Ratio: 11 (ref 9–23)
BUN: 10 mg/dL (ref 6–24)
CALCIUM: 10.4 mg/dL — AB (ref 8.7–10.2)
CO2: 18 mmol/L — AB (ref 20–29)
CREATININE: 0.92 mg/dL (ref 0.57–1.00)
Chloride: 103 mmol/L (ref 96–106)
GFR calc Af Amer: 89 mL/min/{1.73_m2} (ref >59)
GFR, EST NON AFRICAN AMERICAN: 78 mL/min/{1.73_m2} (ref >59)
Glucose: 68 mg/dL (ref 65–99)
Potassium: 4.7 mmol/L (ref 3.5–5.2)
SODIUM: 140 mmol/L (ref 134–144)

## 2017-08-03 LAB — VITAMIN D 25 HYDROXY (VIT D DEFICIENCY, FRACTURES): Vit D, 25-Hydroxy: 16.3 ng/mL — ABNORMAL LOW (ref 30.0–100.0)

## 2017-08-03 LAB — VITAMIN B12: VITAMIN B 12: 216 pg/mL — AB (ref 232–1245)

## 2017-08-03 LAB — CBC WITH DIFFERENTIAL/PLATELET
BASOS: 0 %
Basophils Absolute: 0 10*3/uL (ref 0.0–0.2)
EOS (ABSOLUTE): 0.2 10*3/uL (ref 0.0–0.4)
EOS: 2 %
HEMATOCRIT: 43 % (ref 34.0–46.6)
Hemoglobin: 14.5 g/dL (ref 11.1–15.9)
IMMATURE GRANS (ABS): 0 10*3/uL (ref 0.0–0.1)
IMMATURE GRANULOCYTES: 0 %
LYMPHS: 27 %
Lymphocytes Absolute: 2.1 10*3/uL (ref 0.7–3.1)
MCH: 30.3 pg (ref 26.6–33.0)
MCHC: 33.7 g/dL (ref 31.5–35.7)
MCV: 90 fL (ref 79–97)
MONOCYTES: 7 %
Monocytes Absolute: 0.5 10*3/uL (ref 0.1–0.9)
NEUTROS PCT: 64 %
Neutrophils Absolute: 4.9 10*3/uL (ref 1.4–7.0)
Platelets: 214 10*3/uL (ref 150–379)
RBC: 4.78 x10E6/uL (ref 3.77–5.28)
RDW: 13.4 % (ref 12.3–15.4)
WBC: 7.8 10*3/uL (ref 3.4–10.8)

## 2017-08-03 LAB — TSH: TSH: 3.29 u[IU]/mL (ref 0.450–4.500)

## 2017-08-11 ENCOUNTER — Telehealth: Payer: Self-pay | Admitting: Pediatrics

## 2017-09-09 ENCOUNTER — Encounter: Payer: Self-pay | Admitting: Family Medicine

## 2017-09-09 ENCOUNTER — Ambulatory Visit (INDEPENDENT_AMBULATORY_CARE_PROVIDER_SITE_OTHER): Payer: BC Managed Care – PPO | Admitting: Family Medicine

## 2017-09-09 VITALS — BP 119/82 | HR 85 | Temp 98.0°F | Ht 64.0 in | Wt 198.0 lb

## 2017-09-09 DIAGNOSIS — M545 Low back pain, unspecified: Secondary | ICD-10-CM

## 2017-09-09 DIAGNOSIS — Z2821 Immunization not carried out because of patient refusal: Secondary | ICD-10-CM | POA: Diagnosis not present

## 2017-09-09 DIAGNOSIS — M62830 Muscle spasm of back: Secondary | ICD-10-CM

## 2017-09-09 MED ORDER — CYCLOBENZAPRINE HCL 10 MG PO TABS: 10.0000 mg | ORAL_TABLET | Freq: Three times a day (TID) | ORAL | 0 refills | Status: DC | PRN

## 2017-09-09 MED ORDER — IBUPROFEN 600 MG PO TABS: 600.0000 mg | ORAL_TABLET | Freq: Three times a day (TID) | ORAL | 0 refills | Status: DC | PRN

## 2017-09-09 NOTE — Progress Notes (Signed)
Subjective: CC: back pain HPI: Patient is a 41 y.o. female presenting to clinic today for back pain. Concerns today include:  1. Back Pain Patient reports that right-sided low pain began on Sunday evening and got worse yesterday.  Pain is a 7/10.  It does radiate to her side and her right buttock.  Lifting her right leg worsens pain.  "Ignoring the pain" improves pain.  Patient has been taking Motrin 600 mg once daily and applying heat for pain with little relief.  Patient denies trauma or injury.  Denies saddle anesthesia, urinary retention/incontinence, bowel incontinence, weakness, falls, sensation changes or pain anywhere else.    Allergies  Allergen Reactions  . Penicillins Anaphylaxis and Swelling    Has patient had a PCN reaction causing immediate rash, facial/tongue/throat swelling, SOB or lightheadedness with hypotension: Yes Has patient had a PCN reaction causing severe rash involving mucus membranes or skin necrosis: Yes Has patient had a PCN reaction that required hospitalization No Has patient had a PCN reaction occurring within the last 10 years: No If all of the above answers are "NO", then may proceed with Cephalosporin use.   [name]. Zoloft [Sertraline Hcl] Itching and Rash   Social History   Socioeconomic History  . Marital status: Single    Spouse name: Not on file  . Number of children: 3  . Years of education: college  . Highest education level: Not on file  Social Needs  . Financial resource strain: Not on file  . Food insecurity - worry: Not on file  . Food insecurity - inability: Not on file  . Transportation needs - medical: Not on file  . Transportation needs - non-medical: Not on file  Occupational History    Employer: Engineer, sitechool teacher for [employer]  Tobacco Use  . Smoking status: Current Some Day Smoker    Packs/day: 0.25    Types: Cigarettes  . Smokeless tobacco: Never Used  Substance and Sexual Activity  . Alcohol use: No  . Drug  use: No  . Sexual activity: Yes    Birth control/protection: Other-see comments    Comment: [facility] 2001  Other Topics Concern  . Not on file  Social History Narrative  . Not on file   Past Medical History:  Diagnosis Date  . Anxiety   . Colon polyps   . Depression   . Hypothyroidism     ROS: All other systems reviewed and are negative.  Objective: Office vital signs reviewed. BP 119/82   Pulse 85   Temp 98 F (36.7 C) (Oral)   Ht 5\' 4"  (1.626 m)   Wt 198 lb (89.8 kg)   BMI 33.99 kg/m   Physical Examination:  General: Awake, alert, well nourished, no acute distress Cardio: RRR, S1S2 heard, no murmurs appreciated Pulm: CTAB, no wheezes, rhonchi or rales Extremities: WWP, No edema, cyanosis or clubbing; +2 pulses bilaterally MSK: Normal gait and station  Spine: She has full active range of motion of the lumbar and thoracic spines.  She does have some increased pain with extension of the lumbar spine, negative straight leg test, no midline TTP to the entire spine, no palpable bony deformities, she does have tenderness to the right paraspinal muscles over the lumbosacral junction and into the right gluteus.  There is increased tonicity over the right paraspinal muscles in the lumbar/sacral areas.  Hips: Negative FADIR and FABER bilaterally.  No tenderness to the greater trochanters bilaterally.  No tenderness to the IT band bilaterally.  Skin: No ecchymosis or erythema. Neuro: 5/5 LE Strength bilaterally.  Light touch sensation grossly intact  Assessment/ Plan:  41 y.o. female   1. Acute right-sided low back pain without sciatica Patient with a history of intermittent right lower back pain.  I reviewed the 2017 lumbar film reports which did not reveal acute bony abnormalities.  Her exam is significant for right-sided paraspinal muscle tenderness to palpation with associated increased tonicity in this area.  There is nothing on exam to suggest disc herniation or acute fracture.   Home care instructions and stretches were reviewed with the patient.  A handout was provided.  She was prescribed Flexeril 10 mg to take 3 times daily as needed muscle spasm.  I did caution sedation, and recommended that she not drink with medication and advised that she should stay as physically active as possible to reduce risk of worsening muscle spasm.  I encouraged her to continue Motrin 600 mg 3 times daily with a meal and plenty of water.  She may use topical analgesic of her choice should she find this helpful.  She may continue applying heat but I did recommend that she follow with ice to reduce inflammation. Strict return precautions and reasons for emergent evaluation in the emergency department review with patient.  They voiced understanding and will follow-up as needed.  2. Lumbar paraspinal muscle spasm See above  3. Refused influenza vaccine Counseling provided.    Raliegh IpAshly M Gottschalk, DO

## 2017-09-09 NOTE — Patient Instructions (Addendum)
Your symptoms are consistent with a muscle spasm of the lumbar paraspinal muscles.  I have prescribed a muscle relaxer, which we discussed can cause sedation.  I do recommend that you continue the ibuprofen 3 times a day.  Make sure you take this with a meal and drink plenty of fluids.  Perform the home exercises and stretches.  Stay mobile.  Prolonged resting can increase muscle spasm.  If you develop any of the worrisome signs or symptoms that we discussed, please seek immediate medical attention.

## 2017-09-17 ENCOUNTER — Encounter: Payer: Self-pay | Admitting: Pediatrics

## 2017-09-17 ENCOUNTER — Encounter: Payer: BC Managed Care – PPO | Admitting: Pediatrics

## 2017-09-17 NOTE — Progress Notes (Signed)
Return in 2 mo for repeat labs.

## 2017-10-02 ENCOUNTER — Encounter: Payer: Self-pay | Admitting: Family Medicine

## 2017-10-02 ENCOUNTER — Ambulatory Visit (INDEPENDENT_AMBULATORY_CARE_PROVIDER_SITE_OTHER): Payer: BC Managed Care – PPO

## 2017-10-02 ENCOUNTER — Ambulatory Visit (INDEPENDENT_AMBULATORY_CARE_PROVIDER_SITE_OTHER): Payer: BC Managed Care – PPO | Admitting: Family Medicine

## 2017-10-02 VITALS — BP 101/64 | HR 72 | Temp 97.4°F | Ht 64.0 in | Wt 198.0 lb

## 2017-10-02 DIAGNOSIS — M25512 Pain in left shoulder: Secondary | ICD-10-CM

## 2017-10-02 DIAGNOSIS — M62838 Other muscle spasm: Secondary | ICD-10-CM

## 2017-10-02 MED ORDER — PREDNISONE 20 MG PO TABS: ORAL_TABLET | ORAL | 0 refills | Status: DC

## 2017-10-02 MED ORDER — CYCLOBENZAPRINE HCL 10 MG PO TABS: 10.0000 mg | ORAL_TABLET | Freq: Three times a day (TID) | ORAL | 0 refills | Status: DC | PRN

## 2017-10-02 NOTE — Progress Notes (Signed)
BP 101/64 (BP Location: Right Arm)   Pulse 72   Temp (!) 97.4 F (36.3 C) (Oral)   Ht 5\' 4"  (1.626 m)   Wt 198 lb (89.8 kg)   BMI 33.99 kg/m    Subjective:    Patient ID: Krystal Mckee, female    DOB: October 05, 1976, 41 y.o.   MRN: [id number]  HPI: Krystal Mckee is a 41 y.o. female presenting on 10/02/2017 for left shoulder pain (no injury)   HPI Left shoulder pain Patient is coming in with left shoulder pain that is been going on for about a week.  She cannot recall any specific injury or overuse but it has been worsening over the past few days.  She denies any fevers or chills or numbness or weakness.  She cannot pinpoint any specific movement that makes it worse or better.  She denies any swelling with it.  The pain is mostly located on the back of her left shoulder but does sometimes cause tightness up into her neck and sometimes cause tightness down the back of her left upper arm.  She denies any numbness or fevers or chills.  She denies any neck pain  Relevant past medical, surgical, family and social history reviewed and updated as indicated. Interim medical history since our last visit reviewed. Allergies and medications reviewed and updated.  Review of Systems  Constitutional: Negative for chills and fever.  Eyes: Negative for visual disturbance.  Respiratory: Negative for chest tightness and shortness of breath.   Cardiovascular: Negative for chest pain and leg swelling.  Musculoskeletal: Positive for arthralgias and myalgias. Negative for back pain and gait problem.  Skin: Negative for color change and rash.  Neurological: Negative for light-headedness and headaches.  Psychiatric/Behavioral: Negative for agitation and behavioral problems.  All other systems reviewed and are negative.   Per HPI unless specifically indicated above        Objective:    BP 101/64 (BP Location: Right Arm)   Pulse 72   Temp (!) 97.4 F (36.3 C) (Oral)   Ht 5\' 4"   (1.626 m)   Wt 198 lb (89.8 kg)   BMI 33.99 kg/m   Wt Readings from Last 3 Encounters:  10/02/17 198 lb (89.8 kg)  09/17/17 198 lb (89.8 kg)  09/09/17 198 lb (89.8 kg)    Physical Exam  Constitutional: She is oriented to person, place, and time. She appears well-developed and well-nourished. No distress.  Eyes: Conjunctivae are normal.  Neck: Neck supple. No thyromegaly present.  Cardiovascular: Normal rate, regular rhythm, normal heart sounds and intact distal pulses.  No murmur heard. Pulmonary/Chest: Effort normal and breath sounds normal. No respiratory distress. She has no wheezes. She has no rales.  Musculoskeletal: Normal range of motion. She exhibits tenderness. She exhibits no edema.       Left shoulder: She exhibits tenderness (Tenderness over the muscular groups in the supraspinatus and infraspinatus area.  Negative rotator cuff testing.  Positive impingement sign), pain and spasm. She exhibits normal range of motion, no swelling, no deformity and normal strength.  Lymphadenopathy:    She has no cervical adenopathy.  Neurological: She is alert and oriented to person, place, and time. Coordination normal.  Skin: Skin is warm and dry. No rash noted. She is not diaphoretic.  Psychiatric: She has a normal mood and affect. Her behavior is normal.  Nursing note and vitals reviewed.   Shoulder x-ray: No acute bony abnormality, await final read from radiology  Assessment & Plan:   Problem List Items Addressed This Visit    None    Visit Diagnoses    Muscle spasm of left shoulder    -  Primary   Relevant Medications   cyclobenzaprine (FLEXERIL) 10 MG tablet   predniSONE (DELTASONE) 20 MG tablet   Other Relevant Orders   DG Shoulder Left     Recommended muscle relaxer and Flexeril and if not improved call back in a week and consider physical therapy.  Rotator cuff testing is negative  Follow up plan: Return if symptoms worsen or fail to improve.  Counseling provided  for all of the vaccine components Orders Placed This Encounter  Procedures  . DG Shoulder Left    Arville CareJoshua Dettinger, MD [facility] 10/02/2017, 5:46 PM

## 2018-02-11 ENCOUNTER — Encounter: Payer: Self-pay | Admitting: Pediatrics

## 2018-02-11 ENCOUNTER — Ambulatory Visit: Payer: BC Managed Care – PPO | Admitting: Pediatrics

## 2018-02-11 VITALS — BP 135/74 | HR 102 | Temp 99.0°F | Ht 64.0 in | Wt 198.6 lb

## 2018-02-11 DIAGNOSIS — E538 Deficiency of other specified B group vitamins: Secondary | ICD-10-CM | POA: Diagnosis not present

## 2018-02-11 DIAGNOSIS — F411 Generalized anxiety disorder: Secondary | ICD-10-CM

## 2018-02-11 DIAGNOSIS — E039 Hypothyroidism, unspecified: Secondary | ICD-10-CM | POA: Diagnosis not present

## 2018-02-11 DIAGNOSIS — R609 Edema, unspecified: Secondary | ICD-10-CM | POA: Diagnosis not present

## 2018-02-11 DIAGNOSIS — E559 Vitamin D deficiency, unspecified: Secondary | ICD-10-CM | POA: Diagnosis not present

## 2018-02-11 MED ORDER — HYDROXYZINE HCL 25 MG PO TABS: 25.0000 mg | ORAL_TABLET | Freq: Three times a day (TID) | ORAL | 2 refills | Status: DC | PRN

## 2018-02-11 MED ORDER — HYDROCHLOROTHIAZIDE 12.5 MG PO CAPS: 12.5000 mg | ORAL_CAPSULE | Freq: Every day | ORAL | 5 refills | Status: DC | PRN

## 2018-02-11 MED ORDER — LEVOTHYROXINE SODIUM 100 MCG PO TABS: 100.0000 ug | ORAL_TABLET | Freq: Every day | ORAL | 3 refills | Status: DC

## 2018-02-11 MED ORDER — PAROXETINE HCL 30 MG PO TABS: 60.0000 mg | ORAL_TABLET | Freq: Every morning | ORAL | 1 refills | Status: DC

## 2018-02-11 NOTE — Progress Notes (Signed)
  Subjective:   Patient ID: Krystal Mckee, female    DOB: 28-Feb-1976, 42 y.o.   MRN: [id number] CC: Follow-up Multiple medical problems HPI: Krystal Mckee is a 42 y.o. female presenting for Follow-up Here today with her stepdaughter  Vitamin B12 deficiency: Taking 500 mcg daily oral  Vitamin D deficiency: Taking 5000 IU daily  Anxiety: Well controlled.  Taking paroxetine regularly  Has new swelling over the last few weeks in her hands and feet.  Gets better usually by the morning.  No redness, no pain.   Feels tired at times after eating sweets, like a cup cake.  Eating regularly throughout the day.  Not skipping any meals.  Avoiding salt.  Relevant past medical, surgical, family and social history reviewed. Allergies and medications reviewed and updated. Social History   Tobacco Use  Smoking Status Current Some Day Smoker  . Packs/day: 0.25  . Types: Cigarettes  Smokeless Tobacco Never Used   ROS: Per HPI   Objective:    BP 135/74   Pulse (!) 102   Temp 99 F (37.2 C) (Oral)   Ht 5\' 4"  (1.626 m)   Wt 198 lb 9.6 oz (90.1 kg)   BMI 34.09 kg/m   Wt Readings from Last 3 Encounters:  02/11/18 198 lb 9.6 oz (90.1 kg)  10/02/17 198 lb (89.8 kg)  09/17/17 198 lb (89.8 kg)    Gen: NAD, alert, cooperative with exam, NCAT EYES: EOMI, no conjunctival injection, or no icterus ENT:  TMs pearly gray b/l, OP without erythema LYMPH: no cervical LAD CV: NRRR, normal S1/S2, no murmur, distal pulses 2+ b/l Resp: CTABL, no wheezes, normal WOB Abd: +BS, soft, NTND. no guarding or organomegaly Ext: Nonpitting edema present hands, ankles bilaterally Neuro: Alert and oriented, strength equal b/l UE and LE, coordination grossly normal MSK: normal muscle bulk Psych: Normal affect.  Assessment & Plan:  Krystal Mckee was seen today for follow-up.  Diagnoses and all orders for this visit:  Swelling -     hydrochlorothiazide (MICROZIDE) 12.5 MG capsule; Take 1 capsule (12.5 mg  total) by mouth daily as needed. -     Basic Metabolic Panel -     Urinalysis, Complete  GAD (generalized anxiety disorder) Stable, continue below -     hydrOXYzine (ATARAX/VISTARIL) 25 MG tablet; Take 1 tablet (25 mg total) by mouth 3 (three) times daily as needed. -     PARoxetine (PAXIL) 30 MG tablet; Take 2 tablets (60 mg total) by mouth every morning.  Hypothyroidism, unspecified type -     levothyroxine (SYNTHROID, LEVOTHROID) 100 MCG tablet; Take 1 tablet (100 mcg total) by mouth daily. -     TSH  Vitamin B12 deficiency Continue replacement -     Vitamin B12  Vitamin D deficiency Continue replacement, will recheck levels -     VITAMIN D 25 Hydroxy (Vit-D Deficiency, Fractures)   Follow up plan: Return in about 6 months (around 08/13/2018). Rex Krasarol Amair Shrout, MD [facility]

## 2018-02-12 LAB — BASIC METABOLIC PANEL
BUN/Creatinine Ratio: 10 (ref 9–23)
BUN: 8 mg/dL (ref 6–24)
CALCIUM: 9.9 mg/dL (ref 8.7–10.2)
CHLORIDE: 102 mmol/L (ref 96–106)
CO2: 21 mmol/L (ref 20–29)
Creatinine, Ser: 0.77 mg/dL (ref 0.57–1.00)
GFR calc Af Amer: 110 mL/min/{1.73_m2} (ref >59)
GFR calc non Af Amer: 96 mL/min/{1.73_m2} (ref >59)
GLUCOSE: 80 mg/dL (ref 65–99)
POTASSIUM: 4.4 mmol/L (ref 3.5–5.2)
SODIUM: 139 mmol/L (ref 134–144)

## 2018-02-12 LAB — TSH: TSH: 2.74 u[IU]/mL (ref 0.450–4.500)

## 2018-02-12 LAB — VITAMIN D 25 HYDROXY (VIT D DEFICIENCY, FRACTURES): VIT D 25 HYDROXY: 24.2 ng/mL — AB (ref 30.0–100.0)

## 2018-02-12 LAB — VITAMIN B12: Vitamin B-12: 1415 pg/mL — ABNORMAL HIGH (ref 232–1245)

## 2018-04-30 ENCOUNTER — Ambulatory Visit (INDEPENDENT_AMBULATORY_CARE_PROVIDER_SITE_OTHER): Payer: BC Managed Care – PPO | Admitting: Pediatrics

## 2018-04-30 ENCOUNTER — Encounter: Payer: Self-pay | Admitting: Pediatrics

## 2018-04-30 VITALS — BP 105/71 | HR 73 | Temp 97.5°F | Ht 64.0 in | Wt 197.0 lb

## 2018-04-30 DIAGNOSIS — R609 Edema, unspecified: Secondary | ICD-10-CM

## 2018-04-30 DIAGNOSIS — G4710 Hypersomnia, unspecified: Secondary | ICD-10-CM | POA: Diagnosis not present

## 2018-04-30 NOTE — Progress Notes (Signed)
  Subjective:   Patient ID: Krystal PilaMelinda [name], female    DOB: [birth year], 42 y.o.   MRN: [id number] CC: Follow-up Multiple medical problems. HPI: Krystal Mckee is a 42 y.o. female   Patient feels tired all the time.  Goes to sleep at regular time, around 10:00.  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  She feels tired all the time.  She does snore some.  Her son was recently diagnosed with a genetic mutation for narcolepsy and has been starting treatment.  Feels achy at times in shoulders, back, hips, knees.  Sometimes her hands swell.  No red or hot joints.  Depression: Has been on fluoxetine for years.  It is the only medicine that has been helpful with her symptoms.  She would like to remain on it.  Hypothyroidism: Normal TSH 2.5 months ago  Relevant past medical, surgical, family and social history reviewed. Allergies and medications reviewed and updated. Social History   Tobacco Use  Smoking Status Current Some Day Smoker  . Packs/day: 0.25  . Types: Cigarettes  Smokeless Tobacco Never Used   ROS: Per HPI   Objective:    BP 105/71   Pulse 73   Temp (!) 97.5 F (36.4 C)   Ht 5\' 4"  (1.626 m)   Wt 197 lb (89.4 kg)   BMI 33.81 kg/m   Wt Readings from Last 3 Encounters:  04/30/18 197 lb (89.4 kg)  02/11/18 198 lb 9.6 oz (90.1 kg)  10/02/17 198 lb (89.8 kg)    Gen: NAD, alert, cooperative with exam, NCAT EYES: EOMI, no conjunctival injection, or no icterus ENT:  TMs pearly gray b/l, OP without erythema LYMPH: no cervical LAD CV: NRRR, normal S1/S2, no murmur, distal pulses 2+ b/l Resp: CTABL, no wheezes, normal WOB Abd: +BS, soft, NTND. no guarding or organomegaly Ext: No edema, warm Neuro: Alert and oriented MSK: normal muscle bulk  Assessment & Plan:   Krystal Mckee was seen today for follow-up daytime fatigue.  Diagnoses and all orders for this visit:  Hypersomnolence -     Ambulatory referral to Neurology  Swelling -     Urinalysis, Complete   Follow up plan: Return in about 2 months (around 07/01/2018). Rex Krasarol Javeon Macmurray, MD [facility]

## 2018-05-01 ENCOUNTER — Telehealth: Payer: Self-pay

## 2018-05-01 NOTE — Telephone Encounter (Signed)
[facility] - Patient scored a 12 on the PHQ.  [facility] voice mail was not set up.  Writer will call back again.

## 2018-05-03 ENCOUNTER — Encounter: Payer: Self-pay | Admitting: Pediatrics

## 2018-05-20 ENCOUNTER — Telehealth: Payer: Self-pay

## 2018-05-20 NOTE — Telephone Encounter (Signed)
[facility] - Left Msg on 7-26 -19 and 05-20-2018

## 2018-06-02 ENCOUNTER — Telehealth: Payer: Self-pay

## 2018-06-02 NOTE — Telephone Encounter (Signed)
3rd attempt - [facility]  

## 2018-06-30 ENCOUNTER — Telehealth: Payer: Self-pay

## 2018-06-30 NOTE — Telephone Encounter (Signed)
[facility] - Several attempts have been made to contact patient without success. Patient will be placed on the inactive list.  If services are needed again.  Please contact [facility] at [phone].    Information will be routed to the PCP and Dr. Hisada  

## 2018-07-01 ENCOUNTER — Telehealth: Payer: Self-pay

## 2018-07-01 ENCOUNTER — Institutional Professional Consult (permissible substitution): Payer: BC Managed Care – PPO | Admitting: Neurology

## 2018-07-01 NOTE — Telephone Encounter (Signed)
Pt did not show for their appt with Dr. Athar today.  

## 2018-07-06 ENCOUNTER — Ambulatory Visit (INDEPENDENT_AMBULATORY_CARE_PROVIDER_SITE_OTHER): Payer: BC Managed Care – PPO | Admitting: Pediatrics

## 2018-07-06 ENCOUNTER — Encounter: Payer: Self-pay | Admitting: Pediatrics

## 2018-07-06 VITALS — BP 109/76 | HR 77 | Temp 97.5°F | Ht 64.0 in | Wt 193.6 lb

## 2018-07-06 DIAGNOSIS — Z1239 Encounter for other screening for malignant neoplasm of breast: Secondary | ICD-10-CM

## 2018-07-06 DIAGNOSIS — R609 Edema, unspecified: Secondary | ICD-10-CM | POA: Diagnosis not present

## 2018-07-06 DIAGNOSIS — F411 Generalized anxiety disorder: Secondary | ICD-10-CM | POA: Diagnosis not present

## 2018-07-06 DIAGNOSIS — K5900 Constipation, unspecified: Secondary | ICD-10-CM

## 2018-07-06 DIAGNOSIS — E039 Hypothyroidism, unspecified: Secondary | ICD-10-CM

## 2018-07-06 DIAGNOSIS — G4710 Hypersomnia, unspecified: Secondary | ICD-10-CM

## 2018-07-06 DIAGNOSIS — Z1231 Encounter for screening mammogram for malignant neoplasm of breast: Secondary | ICD-10-CM

## 2018-07-06 MED ORDER — PAROXETINE HCL 30 MG PO TABS: 60.0000 mg | ORAL_TABLET | Freq: Every morning | ORAL | 1 refills | Status: DC

## 2018-07-06 MED ORDER — LEVOTHYROXINE SODIUM 100 MCG PO TABS: 100.0000 ug | ORAL_TABLET | Freq: Every day | ORAL | 1 refills | Status: DC

## 2018-07-06 MED ORDER — HYDROCHLOROTHIAZIDE 12.5 MG PO CAPS: 12.5000 mg | ORAL_CAPSULE | Freq: Every day | ORAL | 5 refills | Status: DC | PRN

## 2018-07-06 NOTE — Progress Notes (Signed)
  Subjective:   Patient ID: Krystal Mckee, female    DOB: May 31, 1976, 42 y.o.   MRN: [id number] CC: Medical Management of Chronic Issues (2 Month, Fatigue)  HPI: Krystal Mckee is a 42 y.o. female   Continues to feel very tired during the day.  Son with recent narcolepsy diagnosis. Would like to be [facility] [place] [facility].  Anxiety and mood: symptoms have been OK controlled with paxil, woul dlike to continue. Recent ongoing stress at home.   Constipation: going several days between stools, more hard stools. Taking over the counter medicines. Is worried her polyps have returned as symptoms are similar to when she had polyps in the past.   Relevant past medical, surgical, family and social history reviewed. Allergies and medications reviewed and updated. Social History   Tobacco Use  Smoking Status Current Some Day Smoker  . Packs/day: 0.25  . Types: Cigarettes  Smokeless Tobacco Never Used   ROS: Per HPI   Objective:    BP 109/76   Pulse 77   Temp (!) 97.5 F (36.4 C) (Oral)   Ht 5\' 4"  (1.626 m)   Wt 193 lb 9.6 oz (87.8 kg)   BMI 33.23 kg/m   Wt Readings from Last 3 Encounters:  07/06/18 193 lb 9.6 oz (87.8 kg)  04/30/18 197 lb (89.4 kg)  02/11/18 198 lb 9.6 oz (90.1 kg)    Gen: NAD, alert, cooperative with exam, NCAT EYES: EOMI, no conjunctival injection, or no icterus ENT:  TMs pearly gray b/l, OP without erythema LYMPH: no cervical LAD CV: NRRR, normal S1/S2, no murmur, distal pulses 2+ b/l Resp: CTABL, no wheezes, normal WOB Abd: +BS, soft, NTND. no guarding or organomegaly Ext: No edema, warm Neuro: Alert and oriented, strength equal b/l UE and LE, coordination grossly normal MSK: normal muscle bulk  Assessment & Plan:  Krystal Mckee was seen today for medical management of chronic issues.  Diagnoses and all orders for this visit:  Constipation, unspecified constipation type Increase prns, fiber intake. Drink plenty of fluids -      Ambulatory referral to Gastroenterology  GAD (generalized anxiety disorder) Stable cont below -     PARoxetine (PAXIL) 30 MG tablet; Take 2 tablets (60 mg total) by mouth every morning.  Hypothyroidism, unspecified type Stable, cont below -     levothyroxine (SYNTHROID, LEVOTHROID) 100 MCG tablet; Take 1 tablet (100 mcg total) by mouth daily.  Swelling Rarely needs below but takes when swelling in ankles is persistent for a couple of days.  -     hydrochlorothiazide (MICROZIDE) 12.5 MG capsule; Take 1 capsule (12.5 mg total) by mouth daily as needed.  Screening for breast cancer -     MM Digital Screening; Future  Hypersomnolence -     Ambulatory referral to Sleep Studies   Follow up plan: Return in about 5 months (around 12/06/2018). Rex Krasarol Vincent, MD [facility]

## 2018-08-20 ENCOUNTER — Encounter: Payer: Self-pay | Admitting: Pediatrics

## 2018-08-20 ENCOUNTER — Encounter: Payer: Self-pay | Admitting: Gastroenterology

## 2018-09-03 ENCOUNTER — Encounter: Payer: Self-pay | Admitting: Pediatrics

## 2018-09-16 ENCOUNTER — Encounter: Payer: Self-pay | Admitting: Pediatrics

## 2018-09-16 ENCOUNTER — Ambulatory Visit: Payer: BC Managed Care – PPO | Admitting: Gastroenterology

## 2018-09-16 ENCOUNTER — Ambulatory Visit: Payer: BC Managed Care – PPO | Admitting: Pediatrics

## 2018-09-16 ENCOUNTER — Encounter: Payer: Self-pay | Admitting: Gastroenterology

## 2018-09-16 VITALS — BP 120/71 | HR 89 | Temp 98.1°F | Ht 64.0 in | Wt 196.8 lb

## 2018-09-16 VITALS — BP 90/64 | HR 80 | Ht 64.0 in | Wt 196.0 lb

## 2018-09-16 DIAGNOSIS — K5909 Other constipation: Secondary | ICD-10-CM | POA: Diagnosis not present

## 2018-09-16 DIAGNOSIS — G4710 Hypersomnia, unspecified: Secondary | ICD-10-CM

## 2018-09-16 DIAGNOSIS — E538 Deficiency of other specified B group vitamins: Secondary | ICD-10-CM | POA: Diagnosis not present

## 2018-09-16 DIAGNOSIS — E559 Vitamin D deficiency, unspecified: Secondary | ICD-10-CM | POA: Diagnosis not present

## 2018-09-16 DIAGNOSIS — Z86010 Personal history of colonic polyps: Secondary | ICD-10-CM

## 2018-09-16 MED ORDER — LINACLOTIDE 145 MCG PO CAPS: 145.0000 ug | ORAL_CAPSULE | Freq: Every day | ORAL | 5 refills | Status: DC

## 2018-09-16 MED ORDER — NA SULFATE-K SULFATE-MG SULF 17.5-3.13-1.6 GM/177ML PO SOLN: 1.0000 | Freq: Once | ORAL | 0 refills | Status: AC

## 2018-09-16 NOTE — Progress Notes (Signed)
  Subjective:   Patient ID: Krystal Mckee, female    DOB: Feb 12, 1976, 42 y.o.   MRN: [id number] CC: Fatigue  HPI: Krystal Mckee is a 42 y.o. female   Had sleep study done in [place].  Diagnosed with OSA.  Has been using CPAP regularly.  Will sleep 7 to 8 hours at night, if she does not have anything to do that day, can sleep an additional 8+ hours during the day and still go to bed at night and sleep fine.  Her son has been diagnosed with narcolepsy.  She drinks about two 20 ounce Pepsi's a day for the caffeine, does help some keep her awake, too much caffeine makes her feel jittery.  She has had a hysterectomy.  She is getting colonoscopy next month for her history of polyps and constipation.  Depression: Her mood has been good.  Paxil has been helping with her symptoms.  Vitamin B12 deficiency: Has been taking oral supplements regularly  Vitamin D deficiency: Stopped taking supplements over the summer she is on his son regularly  Relevant past medical, surgical, family and social history reviewed. Allergies and medications reviewed and updated. Social History   Tobacco Use  Smoking Status Current Some Day Smoker  . Packs/day: 0.25  . Types: Cigarettes  Smokeless Tobacco Never Used   ROS: Per HPI   Objective:    BP 120/71   Pulse 89   Temp 98.1 F (36.7 C) (Oral)   Ht '5\' 4"'$  (1.626 m)   Wt 196 lb 12.8 oz (89.3 kg)   BMI 33.78 kg/m   Wt Readings from Last 3 Encounters:  09/16/18 196 lb 12.8 oz (89.3 kg)  09/16/18 196 lb (88.9 kg)  07/06/18 193 lb 9.6 oz (87.8 kg)    Gen: NAD, alert, cooperative with exam, NCAT EYES: EOMI, no conjunctival injection, or no icterus ENT:  OP without erythema LYMPH: no cervical LAD CV: NRRR, normal S1/S2, no murmur, distal pulses 2+ b/l Resp: CTABL, no wheezes, normal WOB Abd: +BS, soft, NTND.  Ext: No edema, warm Neuro: Alert and oriented, strength equal b/l UE and LE, coordination grossly normal MSK: normal muscle  bulk  Assessment & Plan:  Kare was seen today for fatigue.  Diagnoses and all orders for this visit:  Hypersomnolence Ongoing symptoms.  Sleeping 16+ hours a day when able.  Mood has been fine.  She has an appointment with sleep medicine in 2 weeks.  May need to have additional testing done for narcolepsy.  She has been compliant with her CPAP machine. -     CMP14+EGFR -     CBC with Differential/Platelet -     TSH  Vitamin B12 deficiency Continue oral supplementation.  Will check levels with below. -     Vitamin B12 -     Folate  Vitamin D deficiency Restart taking daily over-the-counter vitamin D. -     VITAMIN D 25 Hydroxy (Vit-D Deficiency, Fractures)   Follow up plan: Return if symptoms worsen or fail to improve. Krystal Found, MD [place]

## 2018-09-16 NOTE — Progress Notes (Signed)
09/16/2018 Krystal Mckee [id number] 11/15/1975  HISTORY OF PRESENT ILLNESS:  This is a 42 year old female who was previously a patient of Dr. Norval Gable.  She was seen by him in 2014 and has not been seen here since that time.  Had colonoscopy by Dr. Jarold Motto in March 2014 at which time the study was normal.  Looks like she had colonoscopy in 2006 and 2009 both of which she had polyps removed but I do not have pathology on those that I can find, although Dr. Jarold Motto comments that they were adenomatous polyps.  Anyway, she has been referred back here on this occasion by her PCP, Dr. Rex Kras, for evaluation regarding chronic constipation.  She tells me that she's had constipation for years but has started to become more of a problem again since this past spring.  Says that she has tried Miralax daily and otherwise has used Ex-lax, etc.  Denies rectal bleeding.  Admits to generalized abdominal discomfort and bloating associated with this.  Says that she gets some nausea and heartburn sensation when she's constipated and has not moved her bowels in quite some time.  It appears that Dr. Jarold Motto put her on Linzess in 2014 but she does not recall this.  Recent CBC, CMP, and TSH ok except for elevated glucose level.  Has family history of colon polyps in her father.   Past Medical History:  Diagnosis Date  . Anxiety   . Colon polyps   . Depression   . Hypothyroidism   . Sleep apnea    Past Surgical History:  Procedure Laterality Date  . ANTERIOR AND POSTERIOR REPAIR    . CESAREAN SECTION    . COLONOSCOPY  01/18/13  . CYST EXCISION     Right wrist  . CYST EXCISION     Left knee  . FOOT FRACTURE SURGERY Right   . PARTIAL HYSTERECTOMY    . TONSILLECTOMY AND ADENOIDECTOMY      reports that she has been smoking cigarettes. She has been smoking about 0.25 packs per day. She has never used smokeless tobacco. She reports that she does not drink alcohol or use  drugs. family history includes Colon polyps in her father; Diabetes in her maternal grandmother; Hepatitis C in her father; Ovarian cancer in her mother. Allergies  Allergen Reactions  . Penicillins Anaphylaxis and Swelling    Has patient had a PCN reaction causing immediate rash, facial/tongue/throat swelling, SOB or lightheadedness with hypotension: Yes Has patient had a PCN reaction causing severe rash involving mucus membranes or skin necrosis: Yes Has patient had a PCN reaction that required hospitalization No Has patient had a PCN reaction occurring within the last 10 years: No If all of the above answers are "NO", then may proceed with Cephalosporin use.   [name] [Sertraline Hcl] Itching and Rash      Outpatient Encounter Medications as of 09/16/2018  Medication Sig  . fluticasone (FLONASE) 50 MCG/ACT nasal spray Place 2 sprays into both nostrils daily.  . hydrochlorothiazide (MICROZIDE) 12.5 MG capsule Take 1 capsule (12.5 mg total) by mouth daily as needed.  Marland Kitchen levothyroxine (SYNTHROID, LEVOTHROID) 100 MCG tablet Take 1 tablet (100 mcg total) by mouth daily.  Marland Kitchen PARoxetine (PAXIL) 30 MG tablet Take 2 tablets (60 mg total) by mouth every morning.   No facility-administered encounter medications on file as of 09/16/2018.      REVIEW OF SYSTEMS  : All other systems reviewed and negative except where noted  in the History of Present Illness.   PHYSICAL EXAM: BP 90/64   Pulse 80   Ht 5\' 4"  (1.626 m)   Wt 196 lb (88.9 kg)   BMI 33.64 kg/m  General: Well developed white female in no acute distress Head: Normocephalic and atraumatic Eyes:  Sclerae anicteric, conjunctiva pink. Ears: Normal auditory acuity Lungs: Clear throughout to auscultation; no increased WOB. Heart: Regular rate and rhythm; no M/R/G. Abdomen: Soft, non-distended.  BS present.  Non-tender. Rectal:  Will be done at the time of colonoscopy. Musculoskeletal: Symmetrical with no gross deformities  Skin: No  lesions on visible extremities Extremities: No edema  Neurological: Alert oriented x 4, grossly non-focal Psychological:  Alert and cooperative. Normal mood and affect  ASSESSMENT AND PLAN: *Personal history of colon polyps:  Last colonoscopy in 2014 ok but had polyps in 2006 and 2009 at young age that were adenomatous according to Dr. Norval GablePatterson's note.  Patient is concerned and would like to have another colonoscopy repeated for reassurance.  Will schedule with Dr. Myrtie Neitheranis. *Chronic constipation:  Will start Linzess 145 mcg daily.  Prescription sent to pharmacy.  **The risks, benefits, and alternatives to colonoscopy were discussed with the patient and she consents to proceed.   CC:  Krystal Mckee, Krystal L, MD

## 2018-09-16 NOTE — Patient Instructions (Signed)
You have been scheduled for a colonoscopy. Please follow written instructions given to you at your visit today.  Please pick up your prep supplies at the pharmacy within the next 1-3 days. If you use inhalers (even only as needed), please bring them with you on the day of your procedure.   If you are age 43 or older, your body mass index should be between 23-30. Your Body mass index is 33.64 kg/m. If this is out of the aforementioned range listed, please consider follow up with your Primary Care Provider.  If you are age 43 or younger, your body mass index should be between 19-25. Your Body mass index is 33.64 kg/m. If this is out of the aformentioned range listed, please consider follow up with your Primary Care Provider.    Thank You for choosing [facility]

## 2018-09-17 LAB — CBC WITH DIFFERENTIAL/PLATELET
BASOS: 1 %
Basophils Absolute: 0 10*3/uL (ref 0.0–0.2)
EOS (ABSOLUTE): 0.1 10*3/uL (ref 0.0–0.4)
EOS: 2 %
HEMATOCRIT: 41 % (ref 34.0–46.6)
Hemoglobin: 14.2 g/dL (ref 11.1–15.9)
IMMATURE GRANS (ABS): 0 10*3/uL (ref 0.0–0.1)
IMMATURE GRANULOCYTES: 0 %
LYMPHS: 21 %
Lymphocytes Absolute: 1.4 10*3/uL (ref 0.7–3.1)
MCH: 31.2 pg (ref 26.6–33.0)
MCHC: 34.6 g/dL (ref 31.5–35.7)
MCV: 90 fL (ref 79–97)
Monocytes Absolute: 0.3 10*3/uL (ref 0.1–0.9)
Monocytes: 4 %
NEUTROS ABS: 4.7 10*3/uL (ref 1.4–7.0)
NEUTROS PCT: 72 %
Platelets: 207 10*3/uL (ref 150–450)
RBC: 4.55 x10E6/uL (ref 3.77–5.28)
RDW: 12.8 % (ref 12.3–15.4)
WBC: 6.5 10*3/uL (ref 3.4–10.8)

## 2018-09-17 LAB — CMP14+EGFR
A/G RATIO: 1.7 (ref 1.2–2.2)
ALT: 21 IU/L (ref 0–32)
AST: 16 IU/L (ref 0–40)
Albumin: 4.3 g/dL (ref 3.5–5.5)
Alkaline Phosphatase: 66 IU/L (ref 39–117)
BUN/Creatinine Ratio: 12 (ref 9–23)
BUN: 11 mg/dL (ref 6–24)
Bilirubin Total: 0.7 mg/dL (ref 0.0–1.2)
CALCIUM: 9.2 mg/dL (ref 8.7–10.2)
CO2: 21 mmol/L (ref 20–29)
CREATININE: 0.91 mg/dL (ref 0.57–1.00)
Chloride: 102 mmol/L (ref 96–106)
GFR, EST AFRICAN AMERICAN: 89 mL/min/{1.73_m2} (ref >59)
GFR, EST NON AFRICAN AMERICAN: 78 mL/min/{1.73_m2} (ref >59)
GLOBULIN, TOTAL: 2.5 g/dL (ref 1.5–4.5)
Glucose: 172 mg/dL — ABNORMAL HIGH (ref 65–99)
POTASSIUM: 4 mmol/L (ref 3.5–5.2)
SODIUM: 139 mmol/L (ref 134–144)
TOTAL PROTEIN: 6.8 g/dL (ref 6.0–8.5)

## 2018-09-17 LAB — TSH: TSH: 3.41 u[IU]/mL (ref 0.450–4.500)

## 2018-09-17 LAB — FOLATE: FOLATE: 8.4 ng/mL (ref >3.0)

## 2018-09-17 LAB — VITAMIN D 25 HYDROXY (VIT D DEFICIENCY, FRACTURES): VIT D 25 HYDROXY: 14 ng/mL — AB (ref 30.0–100.0)

## 2018-09-17 LAB — VITAMIN B12: VITAMIN B 12: 1033 pg/mL (ref 232–1245)

## 2018-09-21 IMAGING — CT CT ABD-PELV W/ CM
2 of 4 series · 16 of 46 positions shown, 18 images · IV contrast (APPLIED)
Comparison: None.

CLINICAL DATA: Sudden onset flank pain worse by coughing or
palpation

EXAM:
CT ABDOMEN AND PELVIS WITH CONTRAST
TECHNIQUE: Multidetector CT imaging of the abdomen and pelvis was performed
using the standard protocol following bolus administration of
intravenous contrast.
CONTRAST:  100mL S3GZCX-LZZ IOPAMIDOL (S3GZCX-LZZ) INJECTION 61%

[Series 2: axial st · axial · 0.68mm/px · z∈[-451,-51]mm · 13 of 88 slices shown, 15 images]
[im 4/88  soft-tissue]
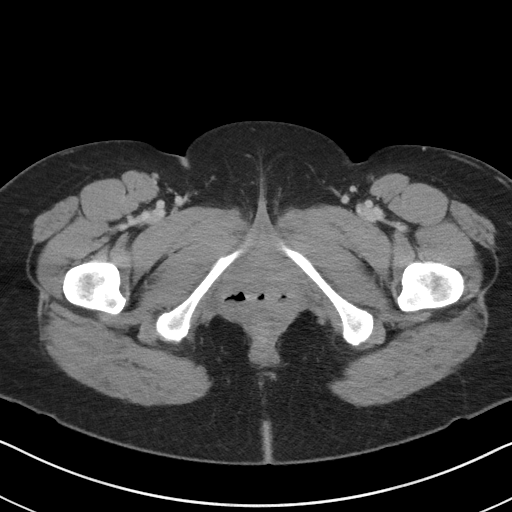
[im 4/88  bone]
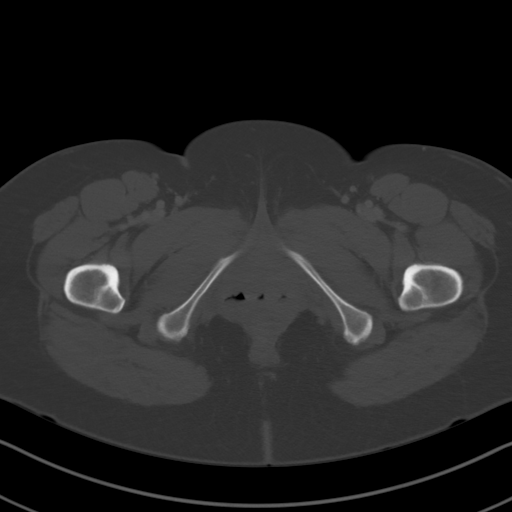
[im 12/88  soft-tissue]
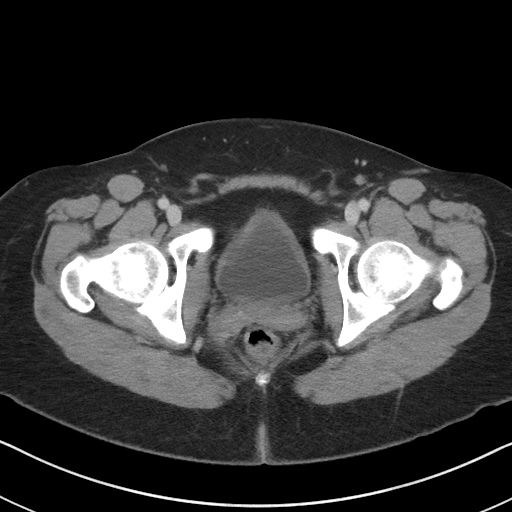
[im 20/88  soft-tissue]
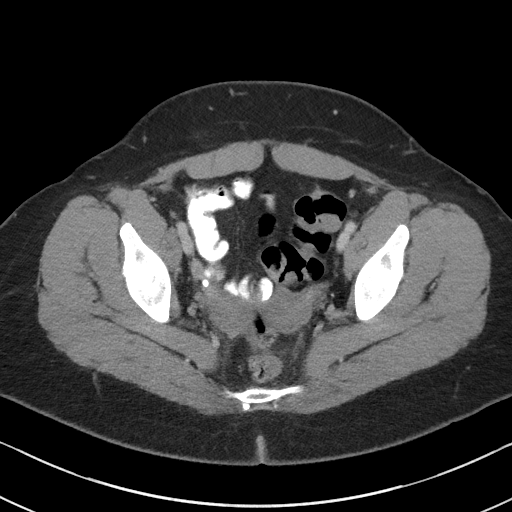
[im 24/88  soft-tissue]
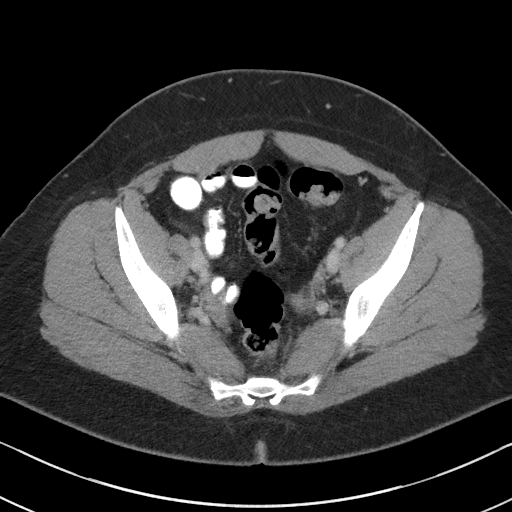
[im 32/88  soft-tissue]
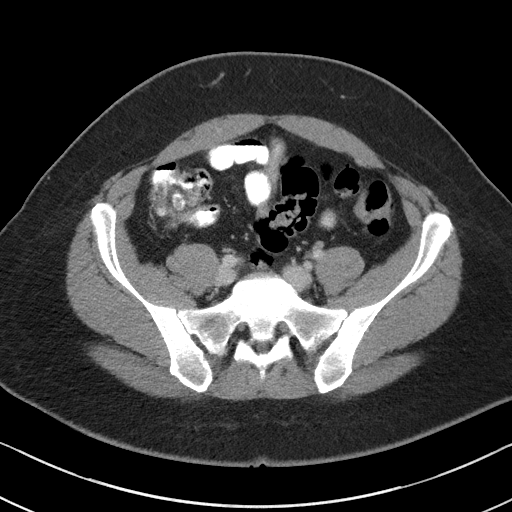
[im 36/88  soft-tissue]
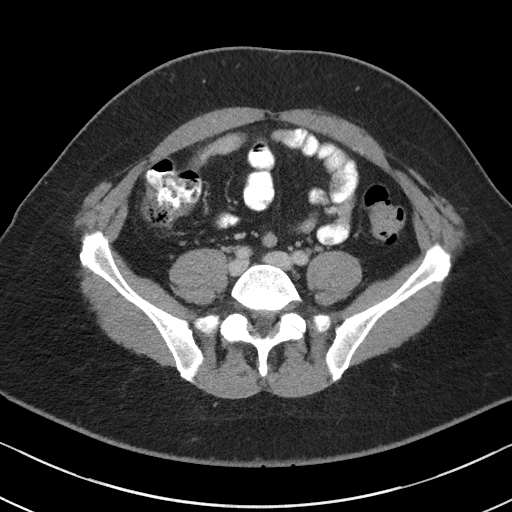
[im 44/88  soft-tissue]
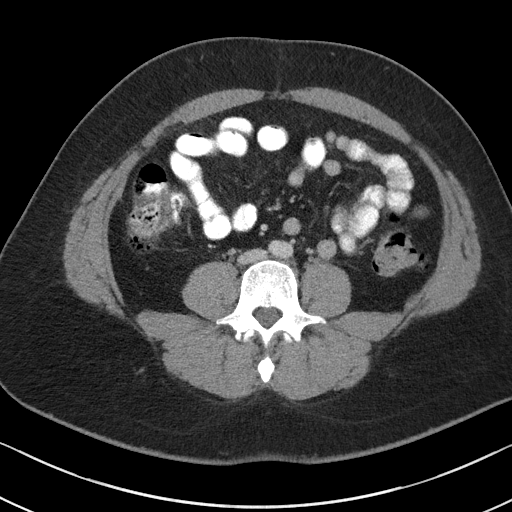
[im 52/88  soft-tissue]
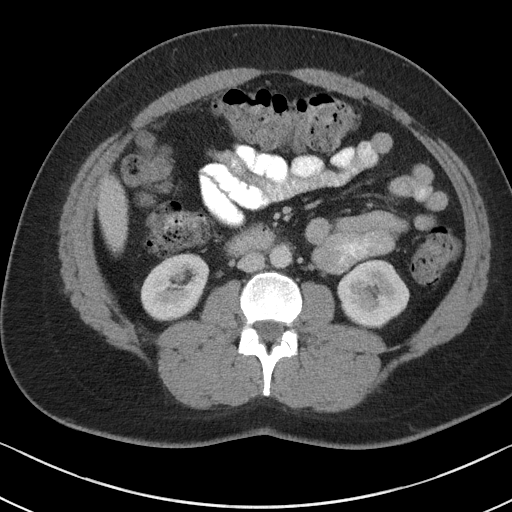
[im 56/88  soft-tissue]
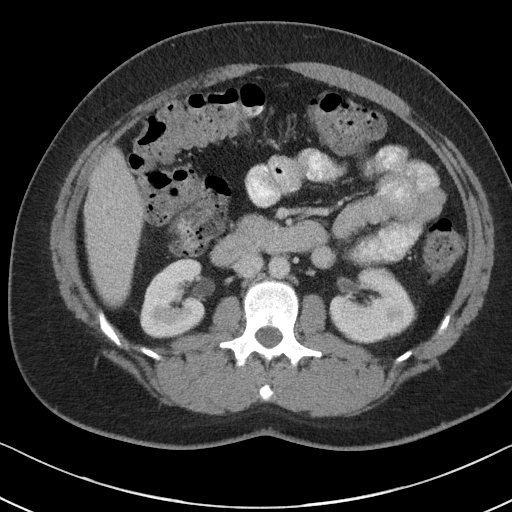
[im 56/88  bone]
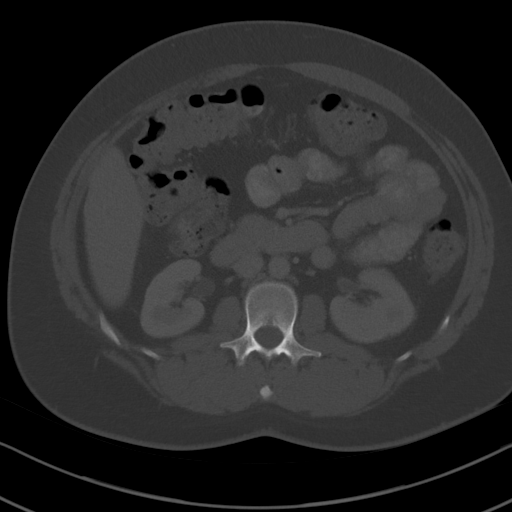
[im 64/88  soft-tissue]
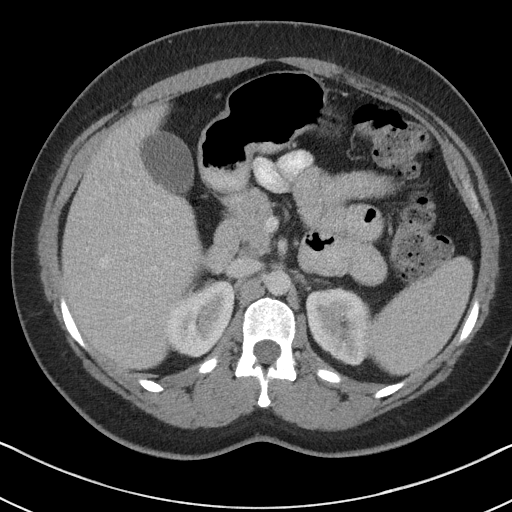
[im 68/88  soft-tissue]
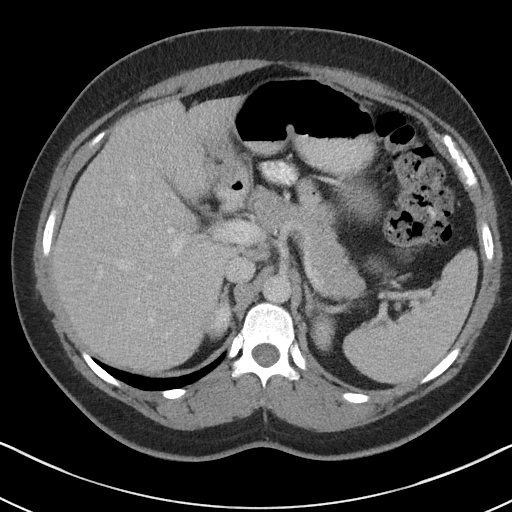
[im 76/88  soft-tissue]
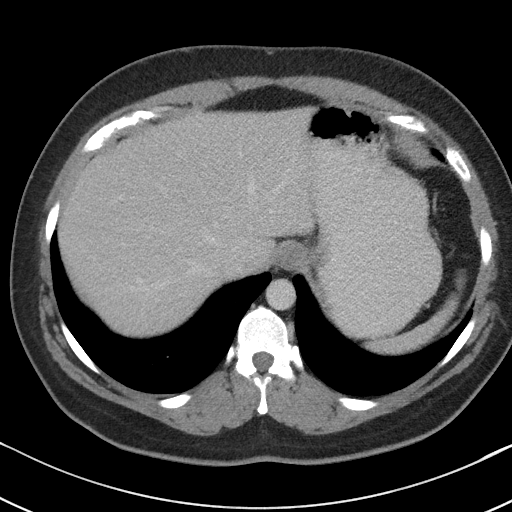
[im 84/88  soft-tissue]
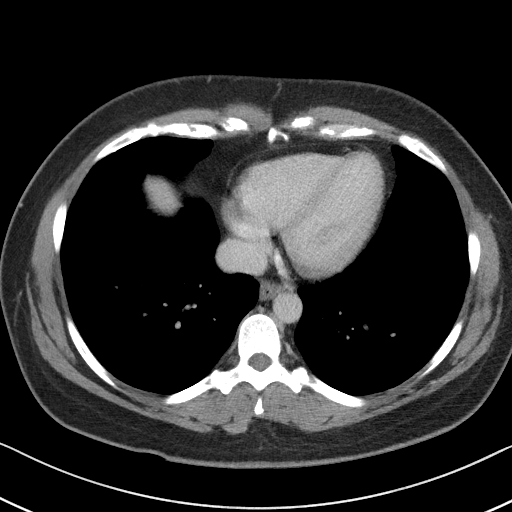

[Series 4: coronal st · coronal · 0.77mm/px · 3 of 101 slices shown]
[im 34/101  soft-tissue]
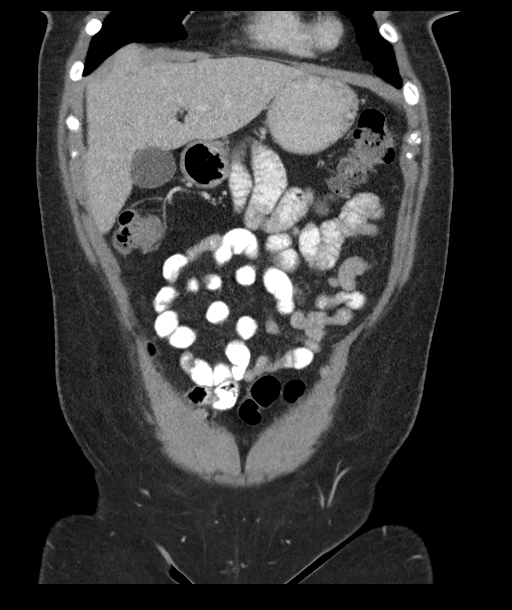
[im 45/101  soft-tissue]
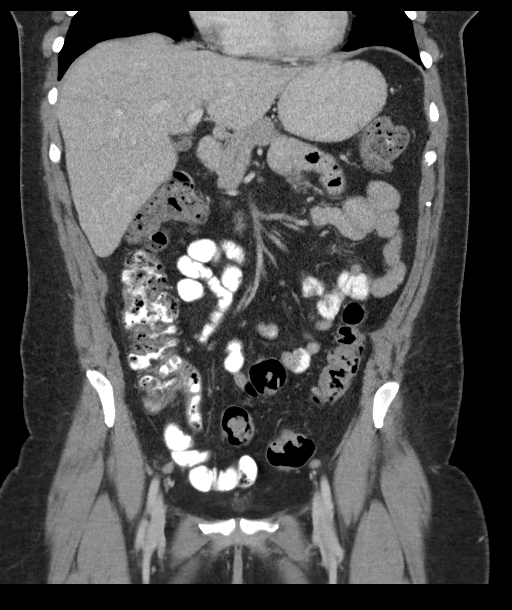
[im 56/101  soft-tissue]
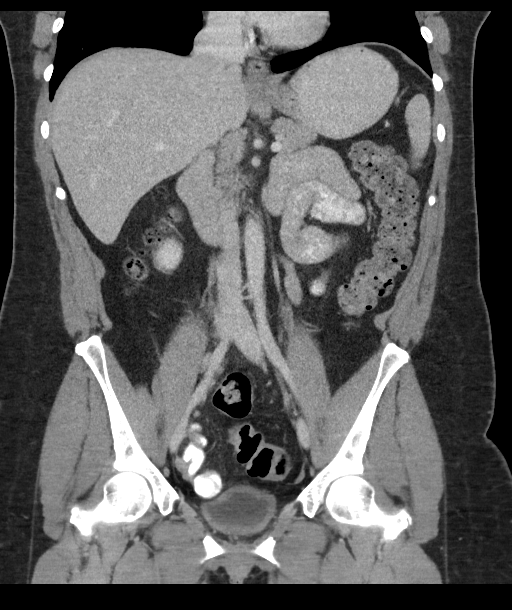

[16 of 46 positions shown; findings below may reference images not displayed]

FINDINGS: Lower chest: No acute abnormality.

Hepatobiliary: No focal liver abnormality is seen. No gallstones,
gallbladder wall thickening, or biliary dilatation.

Pancreas: Unremarkable. No pancreatic ductal dilatation or
surrounding inflammatory changes.

Spleen: Normal in size without focal abnormality.

Adrenals/Urinary Tract: Adrenal glands are unremarkable. Kidneys are
normal, without renal calculi, focal lesion, or hydronephrosis.
Bladder is slightly thick walled in appearance.

Stomach/Bowel: Stomach is within normal limits. Appendix appears
normal. No evidence of bowel wall thickening, distention, or
inflammatory changes.

Vascular/Lymphatic: No significant vascular findings are present. No
enlarged abdominal or pelvic lymph nodes.

Reproductive: Status post hysterectomy. No adnexal masses.

Other: No abdominal wall hernia or abnormality. No abdominopelvic
ascites.

Musculoskeletal: No acute or significant osseous findings.
IMPRESSION: 1. Mildly thick-walled appearance of the urinary bladder, correlate
clinically to exclude cystitis.
2. Otherwise negative CT examination of the abdomen and pelvis.

## 2018-09-23 ENCOUNTER — Other Ambulatory Visit: Payer: Self-pay | Admitting: Pediatrics

## 2018-09-23 ENCOUNTER — Encounter: Payer: Self-pay | Admitting: Gastroenterology

## 2018-09-23 DIAGNOSIS — K5909 Other constipation: Secondary | ICD-10-CM | POA: Insufficient documentation

## 2018-09-23 DIAGNOSIS — E559 Vitamin D deficiency, unspecified: Secondary | ICD-10-CM

## 2018-09-23 DIAGNOSIS — Z86010 Personal history of colonic polyps: Secondary | ICD-10-CM | POA: Insufficient documentation

## 2018-09-23 LAB — SPECIMEN STATUS REPORT

## 2018-09-23 LAB — HGB A1C W/O EAG: Hgb A1c MFr Bld: 5.6 % (ref 4.8–5.6)

## 2018-09-23 MED ORDER — VITAMIN D (ERGOCALCIFEROL) 1.25 MG (50000 UNIT) PO CAPS: 50000.0000 [IU] | ORAL_CAPSULE | ORAL | 0 refills | Status: DC

## 2018-10-01 NOTE — Progress Notes (Signed)
Thank you for sending this case to me. I have reviewed the entire note, and the outlined plan seems appropriate.   Floyd Wade Danis, MD  

## 2018-10-09 ENCOUNTER — Encounter: Payer: Self-pay | Admitting: Gastroenterology

## 2018-10-19 ENCOUNTER — Ambulatory Visit (AMBULATORY_SURGERY_CENTER): Payer: BC Managed Care – PPO | Admitting: Gastroenterology

## 2018-10-19 ENCOUNTER — Encounter: Payer: Self-pay | Admitting: Gastroenterology

## 2018-10-19 VITALS — BP 110/73 | HR 58 | Temp 97.7°F | Resp 13 | Ht 64.0 in | Wt 196.0 lb

## 2018-10-19 DIAGNOSIS — Z860100 Personal history of colon polyps, unspecified: Secondary | ICD-10-CM

## 2018-10-19 MED ORDER — SODIUM CHLORIDE 0.9 % IV SOLN: 500.0000 mL | Freq: Once | INTRAVENOUS | Status: DC

## 2018-10-19 NOTE — Progress Notes (Signed)
Pt's states no medical or surgical changes since previsit or office visit. 

## 2018-10-19 NOTE — Progress Notes (Signed)
PT taken to PACU. Monitors in place. VSS. Report given to RN. 

## 2018-10-19 NOTE — Op Note (Signed)
[facility] Patient Name: Krystal Mckee Procedure Date: 10/19/2018 4:06 PM MRN: [id number] Endoscopist: Sherilyn CooterHenry L. Myrtie Neitheranis , MD Age: [age] Referring MD:  Date of Birth: 20-Jun-1976 Gender: Female Account #: [id number] Procedure:                Colonoscopy Indications:              Surveillance: Personal history of adenomatous                            polyps on last colonoscopy > 5 years ago                            (adenomatous polyps 2009, no polyps in 2014) Medicines:                Monitored Anesthesia Care Procedure:                Pre-Anesthesia Assessment:                           - Prior to the procedure, a History and Physical                            was performed, and patient medications and                            allergies were reviewed. The patient's tolerance of                            previous anesthesia was also reviewed. The risks                            and benefits of the procedure and the sedation                            options and risks were discussed with the patient.                            All questions were answered, and informed consent                            was obtained. Prior Anticoagulants: The patient has                            taken no previous anticoagulant or antiplatelet                            agents. ASA Grade Assessment: II - A patient with                            mild systemic disease. After reviewing the risks                            and benefits, the patient was deemed in  satisfactory condition to undergo the procedure.                           After obtaining informed consent, the colonoscope                            was passed under direct vision. Throughout the                            procedure, the patient's blood pressure, pulse, and                            oxygen saturations were monitored continuously. The                            Colonoscope was  introduced through the anus and                            advanced to the the terminal ileum, with                            identification of the appendiceal orifice and IC                            valve. The colonoscopy was performed without                            difficulty. The patient tolerated the procedure                            well. The quality of the bowel preparation was                            good. The terminal ileum, ileocecal valve,                            appendiceal orifice, and rectum were photographed. Scope In: 4:11:26 PM Scope Out: 4:23:53 PM Scope Withdrawal Time: 0 hours 9 minutes 44 seconds  Total Procedure Duration: 0 hours 12 minutes 27 seconds  Findings:                 The perianal and digital rectal examinations were                            normal.                           A few small-mouthed diverticula were found in the                            left colon and right colon.                           The exam was otherwise without abnormality on  direct and retroflexion views. Complications:            No immediate complications. Estimated Blood Loss:     Estimated blood loss: none. Impression:               - Diverticulosis in the left colon and in the right                            colon.                           - The examination was otherwise normal on direct                            and retroflexion views.                           - No specimens collected. Recommendation:           - Patient has a contact number available for                            emergencies. The signs and symptoms of potential                            delayed complications were discussed with the                            patient. Return to normal activities tomorrow.                            Written discharge instructions were provided to the                            patient.                           - Resume  previous diet.                           - Continue present medications, including Linzess                            for chronic constipation.                           - Repeat colonoscopy in 10 years for screening                            purposes. Henry L. Myrtie Neither, MD 10/19/2018 4:28:03 PM This report has been signed electronically.

## 2018-10-19 NOTE — Patient Instructions (Signed)
YOU HAD AN ENDOSCOPIC PROCEDURE TODAY AT THE [facility]:   Refer to the procedure report that was given to you for any specific questions about what was found during the examination.  If the procedure report does not answer your questions, please call your gastroenterologist to clarify.  If you requested that your care partner not be given the details of your procedure findings, then the procedure report has been included in a sealed envelope for you to review at your convenience later.  YOU SHOULD EXPECT: Some feelings of bloating in the abdomen. Passage of more gas than usual.  Walking can help get rid of the air that was put into your GI tract during the procedure and reduce the bloating. If you had a lower endoscopy (such as a colonoscopy or flexible sigmoidoscopy) you may notice spotting of blood in your stool or on the toilet paper. If you underwent a bowel prep for your procedure, you may not have a normal bowel movement for a few days.  Please Note:  You might notice some irritation and congestion in your nose or some drainage.  This is from the oxygen used during your procedure.  There is no need for concern and it should clear up in a day or so.  SYMPTOMS TO REPORT IMMEDIATELY:   Following lower endoscopy (colonoscopy or flexible sigmoidoscopy):  Excessive amounts of blood in the stool  Significant tenderness or worsening of abdominal pains  Swelling of the abdomen that is new, acute  Fever of 100F or higher  For urgent or emergent issues, a gastroenterologist can be reached at any hour by calling [phone].   DIET:  We do recommend a small meal at first, but then you may proceed to your regular diet.  Drink plenty of fluids but you should avoid alcoholic beverages for 24 hours.  ACTIVITY:  You should plan to take it easy for the rest of today and you should NOT DRIVE or use heavy machinery until tomorrow (because of the sedation medicines used during the test).     FOLLOW UP: Our staff will call the number listed on your records the next business day following your procedure to check on you and address any questions or concerns that you may have regarding the information given to you following your procedure. If we do not reach you, we will leave a message.  However, if you are feeling well and you are not experiencing any problems, there is no need to return our call.  We will assume that you have returned to your regular daily activities without incident.  If any biopsies were taken you will be contacted by phone or by letter within the next 1-3 weeks.  Please call us at [phone] if you have not heard about the biopsies in 3 weeks.    SIGNATURES/CONFIDENTIALITY: You and/or your care partner have signed paperwork which will be entered into your electronic medical record.  These signatures attest to the fact that that the information above on your After Visit Summary has been reviewed and is understood.  Full responsibility of the confidentiality of this discharge information lies with you and/or your care-partner. 

## 2018-10-20 ENCOUNTER — Telehealth: Payer: Self-pay

## 2018-10-20 ENCOUNTER — Telehealth: Payer: Self-pay | Admitting: *Deleted

## 2018-10-20 NOTE — Telephone Encounter (Signed)
Left message on f/u call 

## 2018-10-20 NOTE — Telephone Encounter (Signed)
No answer, left message to call back later today, B.Rayhana Slider RN. 

## 2018-12-07 ENCOUNTER — Ambulatory Visit: Payer: BC Managed Care – PPO | Admitting: Family Medicine

## 2018-12-07 ENCOUNTER — Ambulatory Visit: Payer: BC Managed Care – PPO | Admitting: Pediatrics

## 2018-12-07 ENCOUNTER — Other Ambulatory Visit: Payer: Self-pay | Admitting: Pediatrics

## 2018-12-07 DIAGNOSIS — E559 Vitamin D deficiency, unspecified: Secondary | ICD-10-CM

## 2018-12-08 ENCOUNTER — Encounter: Payer: Self-pay | Admitting: Family Medicine

## 2019-01-12 ENCOUNTER — Encounter: Payer: Self-pay | Admitting: Family Medicine

## 2019-01-12 ENCOUNTER — Ambulatory Visit (INDEPENDENT_AMBULATORY_CARE_PROVIDER_SITE_OTHER): Payer: BC Managed Care – PPO | Admitting: Family Medicine

## 2019-01-12 ENCOUNTER — Other Ambulatory Visit: Payer: Self-pay

## 2019-01-12 VITALS — BP 115/77 | HR 92 | Temp 99.5°F | Ht 64.0 in | Wt 190.0 lb

## 2019-01-12 DIAGNOSIS — F325 Major depressive disorder, single episode, in full remission: Secondary | ICD-10-CM

## 2019-01-12 DIAGNOSIS — F411 Generalized anxiety disorder: Secondary | ICD-10-CM | POA: Diagnosis not present

## 2019-01-12 DIAGNOSIS — M7989 Other specified soft tissue disorders: Secondary | ICD-10-CM | POA: Diagnosis not present

## 2019-01-12 DIAGNOSIS — E039 Hypothyroidism, unspecified: Secondary | ICD-10-CM | POA: Diagnosis not present

## 2019-01-12 DIAGNOSIS — M2550 Pain in unspecified joint: Secondary | ICD-10-CM

## 2019-01-12 DIAGNOSIS — E559 Vitamin D deficiency, unspecified: Secondary | ICD-10-CM

## 2019-01-12 MED ORDER — NAPROXEN 500 MG PO TABS: 500.0000 mg | ORAL_TABLET | Freq: Two times a day (BID) | ORAL | 0 refills | Status: DC

## 2019-01-12 MED ORDER — HYDROCHLOROTHIAZIDE 12.5 MG PO CAPS: 12.5000 mg | ORAL_CAPSULE | Freq: Every day | ORAL | 5 refills | Status: DC | PRN

## 2019-01-12 MED ORDER — PAROXETINE HCL 30 MG PO TABS: 60.0000 mg | ORAL_TABLET | Freq: Every morning | ORAL | 1 refills | Status: DC

## 2019-01-12 MED ORDER — LEVOTHYROXINE SODIUM 100 MCG PO TABS: 100.0000 ug | ORAL_TABLET | Freq: Every day | ORAL | 1 refills | Status: DC

## 2019-01-12 NOTE — Patient Instructions (Addendum)
You had labs performed today.  You will be contacted with the results of the labs once they are available, usually in the next 3 business days for routine lab work.  If you had a pap smear or biopsy performed, expect to be contacted in about 7-10 days.  This certainly could be for overuse but I am evaluating you for possible gout/ pseudogout/ autoimmune arthritis.  Let me know if this gets worse.  I have sent in Naprosyn 500mg  to take twice daily if needed.  Take with food.  We discussed the risk of GI bleed with the paxil.  You have prescribed a nonsteroidal anti-inflammatory drug (NSAID) today. This will help with your pain and inflammation. Please do not take any other NSAIDs (ibuprofen/Motrin/Advil, naproxen/Aleve, meloxicam/Mobic, Voltaren/diclofenac). Please make sure to eat a meal when taking this medication.   Caution:  If you have a history of acid reflux/indigestion, I recommend that you take an antacid (such as Prilosec, Prevacid) daily while on the NSAID.  If you have a history of bleeding disorder, gastric ulcer, are on a blood thinner (like warfarin/Coumadin, Xarelto, Eliquis, etc) please do not take NSAID.  If you have ever had a heart attack, you should not take NSAIDs.

## 2019-01-12 NOTE — Progress Notes (Signed)
Subjective: QB:HALPFX pain PCP: Krystal Ip, DO Krystal Mckee is a 43 y.o. female presenting to clinic today for:  1. Finger pain Patient reports onset of right pointer finger pain and swelling, particularly at the distal aspect of the finger and PIP joint a few days ago.  Symptoms have gotten somewhat worse/are not resolving.  She has been using ice and heat in efforts to improve symptoms but has not taken any oral medications.  Denies any preceding injury but does note that she had been coloring quite a bit prior to onset and she wonders if it is an overuse injury.  Medical history is significant for thyroid disease on Synthroid.  No known autoimmune disease within the family or personal history of autoimmune disease.  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    2. GAD/Depression History: Longstanding history with failure of multiple SSRIs.  Paxil has been working very well for the patient and she has been stable on this for many years.  Patient reports history of OCD behaviors, self isolating behaviors and social anxiety such that she did not interact with the public for a while.  She has been stable on Paxil and notes that energy is excellent, particularly on the Provigil that is prescribed by her sleep doctor.  She denies any depressive or anxiety symptoms.  No excessive sedation from the Paxil.  3. Vit D deficiency Is not taking any over-the-counter vitamin D supplements but did finish the prescribed high-dose vitamin D prescribed by her previous PCP.  4.  Thyroid disorder Patient with several year history of thyroid disorder.  She notes that this was found incidentally on laboratory work-up.  No known history of neck surgery or radiation to the neck.  No diarrhea, constipation, heart palpitations, hair thinning, skin changes, change in voice or difficulty swallowing.  Family history  significant for thyroid disorder in her mother.   ROS: Per HPI  Allergies  Allergen Reactions  . Penicillins Anaphylaxis and Swelling    Has patient had a PCN reaction causing immediate rash, facial/tongue/throat swelling, SOB or lightheadedness with hypotension: Yes Has patient had a PCN reaction causing severe rash involving mucus membranes or skin necrosis: Yes Has patient had a PCN reaction that required hospitalization No Has patient had a PCN reaction occurring within the last 10 years: No If all of the above answers are "NO", then may proceed with Cephalosporin use.   Krystal Mckee [Krystal Mckee] Itching and Rash   Past Medical History:  Diagnosis Date  . Anxiety   . Colon polyps   . Depression   . Hypothyroidism   . Sleep apnea     Current Outpatient Medications:  .  fluticasone (FLONASE) 50 MCG/ACT nasal spray, Place 2 sprays into both nostrils daily., Disp: 16 g, Rfl: 6 .  hydrochlorothiazide (MICROZIDE) 12.5 MG capsule, Take 1 capsule (12.5 mg total) by mouth daily as needed., Disp: 30 capsule, Rfl: 5 .  levothyroxine (SYNTHROID, LEVOTHROID) 100 MCG tablet, Take 1 tablet (100 mcg total) by mouth daily., Disp: 90 tablet, Rfl: 1 .  linaclotide (LINZESS) 145 MCG CAPS capsule, Take 1 capsule (145 mcg total) by mouth daily before breakfast., Disp: 30 capsule, Rfl: 5 .  PARoxetine (PAXIL) 30 MG tablet, Take 2 tablets (60 mg total) by mouth every morning., Disp: 180 tablet, Rfl: 1 .  Vitamin D, Ergocalciferol, (DRISDOL) 1.25 MG (50000 UT) CAPS capsule, TAKE 1  CAPSULE (50,000 UNITS TOTAL) BY MOUTH EVERY 7 (SEVEN) DAYS., Disp: 8 capsule, Rfl: 0 .  modafinil (PROVIGIL) 200 MG tablet, TAKE 1 OR 2 TABLETS BY MOUTH EVERY MORNING, Disp: , Rfl:  Social History   Socioeconomic History  . Marital status: Single    Spouse name: Not on file  . Number of children: 3  . Years of education: college  . Highest education level: Not on file  Occupational History    Employer: [employer]  Social  Needs  . Financial resource strain: Not on file  . Food insecurity:    Worry: Not on file    Inability: Not on file  . Transportation needs:    Medical: Not on file    Non-medical: Not on file  Tobacco Use  . Smoking status: Current Some Day Smoker    Packs/day: 0.25    Types: Cigarettes  . Smokeless tobacco: Never Used  Substance and Sexual Activity  . Alcohol use: No  . Drug use: No  . Sexual activity: Yes    Birth control/protection: Other-see comments    Comment: [facility] 2001  Lifestyle  . Physical activity:    Days per week: Not on file    Minutes per session: Not on file  . Stress: Not on file  Relationships  . Social connections:    Talks on phone: Not on file    Gets together: Not on file    Attends religious service: Not on file    Active member of club or organization: Not on file    Attends meetings of clubs or organizations: Not on file    Relationship status: Not on file  . Intimate partner violence:    Fear of current or ex partner: Not on file    Emotionally abused: Not on file    Physically abused: Not on file    Forced sexual activity: Not on file  Other Topics Concern  . Not on file  Social History Narrative  . Not on file   Family History  Problem Relation Age of Onset  . Colon polyps Father   . Hepatitis C Father   . Ovarian cancer Mother   . Diabetes Maternal Grandmother   . Colon cancer Neg Hx   . Pancreatic cancer Neg Hx   . Stomach cancer Neg Hx   . Esophageal cancer Neg Hx     Objective: Office vital signs reviewed. BP 115/77   Pulse 92   Temp 99.5 F (37.5 C) (Oral)   Ht  (1.626 m)   Wt 190 lb (86.2 kg)   BMI 32.61 kg/m   Physical Examination:  General: Awake, alert, well nourished, No acute distress HEENT: Normal; no goiter, no thyroid masses. No exophthalmos Cardio: regular rate and rhythm, S1S2 heard, no murmurs appreciated Pulm: clear to auscultation bilaterally, no wheezes, rhonchi or rales; normal work of breathing  on room air Extremities: warm, well perfused, No edema, cyanosis or clubbing; +2 pulses bilaterally MSK:   Right hand: Pointer finger of right hand with mild to moderate soft tissue swelling.  She has mild tenderness to palpation to the DIP and PIP joints.  No palpable bony abnormalities.  No increased warmth or erythema. Skin: dry; intact; no rashes or lesions; normal temperature Neuro: no tremor.  No focal neurologic deficits Psych: Mood stable, speech normal, affect appropriate, pleasant and interactive.  Depression screen Krystal Mckee 2/9 01/12/2019 09/16/2018 07/06/2018  Decreased Interest 0 0 1  Down, Depressed, Hopeless 0 0 1  PHQ -  2 Score 0 0 2  Altered sleeping 0 - 1  Tired, decreased energy 1 - 3  Change in appetite 0 - 0  Feeling bad or failure about yourself  0 - 0  Trouble concentrating 1 - 3  Moving slowly or fidgety/restless 0 - 0  Suicidal thoughts 0 - 0  PHQ-9 Score 2 - 9  Difficult doing work/chores - - Somewhat difficult  Some recent data might be hidden   GAD 7 : Generalized Anxiety Score 01/12/2019 09/25/2016  Nervous, Anxious, on Edge 0 3  Control/stop worrying 1 3  Worry too much - different things 1 3  Trouble relaxing 0 3  Restless 0 2  Easily annoyed or irritable 1 3  Afraid - awful might happen 0 3  Total GAD 7 Score 3 20  Anxiety Difficulty Not difficult at all Very difficult    Assessment/ Plan: 43 y.o. female   1. Swollen finger Uncertain etiology.  Most certainly could be related to overuse injury.  I find it somewhat confounding that she is having involvements of 2 joints of the right pointer finger.  Because of history of polyarthralgia, I have placed orders to further evaluate for possible autoimmune arthritis versus gout.  Start Naprosyn p.o. twice daily.  Take with food.  Avoid other NSAIDs.  We discussed use of SSRI may increase risk for GI bleed.  She will contact me with updates on how this medication is helping.  May need to consider use of  prednisone if does not respond to NSAIDs. - ANA w/Reflex if Positive - Sedimentation Rate - C-reactive protein - CBC - Uric Acid - Basic Metabolic Panel - naproxen (NAPROSYN) 500 MG tablet; Take 1 tablet (500 mg total) by mouth 2 (two) times daily with a meal. (if needed for pain/ swelling)  Dispense: 30 tablet; Refill: 0  2. Polyarthralgia As above - ANA w/Reflex if Positive - Sedimentation Rate - C-reactive protein - CBC - Uric Acid - Basic Metabolic Panel - naproxen (NAPROSYN) 500 MG tablet; Take 1 tablet (500 mg total) by mouth 2 (two) times daily with a meal. (if needed for pain/ swelling)  Dispense: 30 tablet; Refill: 0  3. Hypothyroidism, unspecified type Seemingly controlled.  Check TSH.  I reviewed her labs from previous office visit with previous PCP - levothyroxine (SYNTHROID, LEVOTHROID) 100 MCG tablet; Take 1 tablet (100 mcg total) by mouth daily.  Dispense: 90 tablet; Refill: 1 - TSH  4. GAD (generalized anxiety disorder) Controlled.  Continue Paxil at OCD dosing 60 mg/day.  This is max dose. - PARoxetine (PAXIL) 30 MG tablet; Take 2 tablets (60 mg total) by mouth every morning.  Dispense: 180 tablet; Refill: 1 - TSH - Basic Metabolic Panel  5. Major depressive disorder with single episode, in full remission (HCC) Controlled. - TSH - Basic Metabolic Panel  6. Vitamin D deficiency Not on OTC supplementation at this time.  Check vitamin D level. - VITAMIN D 25 Hydroxy (Vit-D Deficiency, Fractures)   Orders Placed This Encounter  Procedures  . ANA w/Reflex if Positive  . Sedimentation Rate  . C-reactive protein  . CBC  . Uric Acid  . TSH  . VITAMIN D 25 Hydroxy (Vit-D Deficiency, Fractures)  . Basic Metabolic Panel   Meds ordered this encounter  Medications  . levothyroxine (SYNTHROID, LEVOTHROID) 100 MCG tablet    Sig: Take 1 tablet (100 mcg total) by mouth daily.    Dispense:  90 tablet    Refill:  1  .  PARoxetine (PAXIL) 30 MG tablet    Sig:  Take 2 tablets (60 mg total) by mouth every morning.    Dispense:  180 tablet    Refill:  1  . hydrochlorothiazide (MICROZIDE) 12.5 MG capsule    Sig: Take 1 capsule (12.5 mg total) by mouth daily as needed.    Dispense:  30 capsule    Refill:  5  . naproxen (NAPROSYN) 500 MG tablet    Sig: Take 1 tablet (500 mg total) by mouth 2 (two) times daily with a meal. (if needed for pain/ swelling)    Dispense:  30 tablet    Refill:  0      Hulen Skains, DO [facility] [phone]

## 2019-01-13 ENCOUNTER — Other Ambulatory Visit: Payer: Self-pay | Admitting: Family Medicine

## 2019-01-13 LAB — BASIC METABOLIC PANEL
BUN / CREAT RATIO: 12 (ref 9–23)
BUN: 9 mg/dL (ref 6–24)
CO2: 22 mmol/L (ref 20–29)
Calcium: 9.7 mg/dL (ref 8.7–10.2)
Chloride: 101 mmol/L (ref 96–106)
Creatinine, Ser: 0.76 mg/dL (ref 0.57–1.00)
GFR calc Af Amer: 111 mL/min/{1.73_m2} (ref >59)
GFR calc non Af Amer: 96 mL/min/{1.73_m2} (ref >59)
Glucose: 90 mg/dL (ref 65–99)
Potassium: 4.5 mmol/L (ref 3.5–5.2)
Sodium: 139 mmol/L (ref 134–144)

## 2019-01-13 LAB — C-REACTIVE PROTEIN: CRP: 4 mg/L (ref 0–10)

## 2019-01-13 LAB — URIC ACID: Uric Acid: 5 mg/dL (ref 2.5–7.1)

## 2019-01-13 LAB — CBC
Hematocrit: 41.1 % (ref 34.0–46.6)
Hemoglobin: 14.5 g/dL (ref 11.1–15.9)
MCH: 31.5 pg (ref 26.6–33.0)
MCHC: 35.3 g/dL (ref 31.5–35.7)
MCV: 89 fL (ref 79–97)
PLATELETS: 205 10*3/uL (ref 150–450)
RBC: 4.6 x10E6/uL (ref 3.77–5.28)
RDW: 12.2 % (ref 11.7–15.4)
WBC: 7.6 10*3/uL (ref 3.4–10.8)

## 2019-01-13 LAB — SEDIMENTATION RATE: Sed Rate: 23 mm/hr (ref 0–32)

## 2019-01-13 LAB — VITAMIN D 25 HYDROXY (VIT D DEFICIENCY, FRACTURES): Vit D, 25-Hydroxy: 17.4 ng/mL — ABNORMAL LOW (ref 30.0–100.0)

## 2019-01-13 LAB — TSH: TSH: 1.26 u[IU]/mL (ref 0.450–4.500)

## 2019-01-13 LAB — ANA W/REFLEX IF POSITIVE: Anti Nuclear Antibody (ANA): NEGATIVE

## 2019-01-13 MED ORDER — CHOLECALCIFEROL 1.25 MG (50000 UT) PO CAPS: 50000.0000 [IU] | ORAL_CAPSULE | ORAL | 0 refills | Status: AC

## 2019-04-12 ENCOUNTER — Other Ambulatory Visit: Payer: BC Managed Care – PPO

## 2019-04-12 ENCOUNTER — Encounter: Payer: Self-pay | Admitting: Family Medicine

## 2019-04-12 ENCOUNTER — Ambulatory Visit (INDEPENDENT_AMBULATORY_CARE_PROVIDER_SITE_OTHER): Payer: BC Managed Care – PPO | Admitting: Family Medicine

## 2019-04-12 ENCOUNTER — Telehealth: Payer: Self-pay | Admitting: *Deleted

## 2019-04-12 ENCOUNTER — Other Ambulatory Visit: Payer: Self-pay

## 2019-04-12 DIAGNOSIS — J0110 Acute frontal sinusitis, unspecified: Secondary | ICD-10-CM | POA: Diagnosis not present

## 2019-04-12 DIAGNOSIS — Z20822 Contact with and (suspected) exposure to covid-19: Secondary | ICD-10-CM

## 2019-04-12 MED ORDER — AZITHROMYCIN 250 MG PO TABS: ORAL_TABLET | ORAL | 0 refills | Status: DC

## 2019-04-12 NOTE — Progress Notes (Signed)
Virtual Visit via telephone Note  I connected with Krystal Mckee on 04/12/19 at 1430 by telephone and verified that I am speaking with the correct person using two identifiers. Krystal Mckee is currently located at home and no other people are currently with her during visit. The provider, Fransisca Kaufmann Shelma Eiben, MD is located in their office at time of visit.  Call ended at 1441  I discussed the limitations, risks, security and privacy concerns of performing an evaluation and management service by telephone and the availability of in person appointments. I also discussed with the patient that there may be a patient responsible charge related to this service. The patient expressed understanding and agreed to proceed.   History and Present Illness: Patient calls in complaining of feeling lightheaded and dizziness and congestion and sinus pressure.  She denies fevers, chills, sob, or covid contacts.  She does not get regular sinus issues. She has congestion at night and through the day. She has been using flonase without much success.  She has been fighting this for 3 days.  No diagnosis found.  Outpatient Encounter Medications as of 04/12/2019  Medication Sig  . fluticasone (FLONASE) 50 MCG/ACT nasal spray Place 2 sprays into both nostrils daily.  . hydrochlorothiazide (MICROZIDE) 12.5 MG capsule Take 1 capsule (12.5 mg total) by mouth daily as needed.  Marland Kitchen levothyroxine (SYNTHROID, LEVOTHROID) 100 MCG tablet Take 1 tablet (100 mcg total) by mouth daily.  Marland Kitchen linaclotide (LINZESS) 145 MCG CAPS capsule Take 1 capsule (145 mcg total) by mouth daily before breakfast.  . modafinil (PROVIGIL) 200 MG tablet TAKE 1 OR 2 TABLETS BY MOUTH EVERY MORNING  . naproxen (NAPROSYN) 500 MG tablet Take 1 tablet (500 mg total) by mouth 2 (two) times daily with a meal. (if needed for pain/ swelling)  . PARoxetine (PAXIL) 30 MG tablet Take 2 tablets (60 mg total) by mouth every morning.  . Vitamin D,  Ergocalciferol, (DRISDOL) 1.25 MG (50000 UT) CAPS capsule TAKE 1 CAPSULE (50,000 UNITS TOTAL) BY MOUTH EVERY 7 (SEVEN) DAYS.   No facility-administered encounter medications on file as of 04/12/2019.     Review of Systems  Constitutional: Positive for fatigue. Negative for chills and fever.  HENT: Positive for congestion, postnasal drip, rhinorrhea, sinus pressure and sneezing. Negative for ear discharge, ear pain and sore throat.   Eyes: Negative for pain, redness and visual disturbance.  Respiratory: Positive for cough. Negative for chest tightness and shortness of breath.   Cardiovascular: Negative for chest pain and leg swelling.  Genitourinary: Negative for difficulty urinating and dysuria.  Musculoskeletal: Negative for back pain and gait problem.  Skin: Negative for rash.  Neurological: Negative for light-headedness and headaches.  Psychiatric/Behavioral: Negative for agitation and behavioral problems.  All other systems reviewed and are negative.   Observations/Objective: Patient sounds comfortable and in no acute distress  Assessment and Plan: Problem List Items Addressed This Visit    None    Visit Diagnoses    Acute non-recurrent frontal sinusitis    -  Primary   Relevant Medications   azithromycin (ZITHROMAX) 250 MG tablet       Follow Up Instructions:  Recommended quarantine until testing and covid testing  Will treat like sinus infection.   I discussed the assessment and treatment plan with the patient. The patient was provided an opportunity to ask questions and all were answered. The patient agreed with the plan and demonstrated an understanding of the instructions.   The patient was  advised to call back or seek an in-person evaluation if the symptoms worsen or if the condition fails to improve as anticipated.  The above assessment and management plan was discussed with the patient. The patient verbalized understanding of and has agreed to the management  plan. Patient is aware to call the clinic if symptoms persist or worsen. Patient is aware when to return to the clinic for a follow-up visit. Patient educated on when it is appropriate to go to the emergency department.    I provided 11 minutes of non-face-to-face time during this encounter.    Nils PyleJoshua A Mayur Duman, MD

## 2019-04-12 NOTE — Telephone Encounter (Signed)
Appointment made today for 3:30p at the [place] site. Does not have a mask. Informed to ask for one and stay in vehicle.

## 2019-04-12 NOTE — Telephone Encounter (Signed)
-----   Message from Worthy Rancher, MD sent at 04/12/2019  2:42 PM EDT ----- Coronavirus testing

## 2019-04-15 LAB — NOVEL CORONAVIRUS, NAA: SARS-CoV-2, NAA: NOT DETECTED

## 2019-06-02 ENCOUNTER — Other Ambulatory Visit: Payer: Self-pay | Admitting: Family Medicine

## 2019-06-02 ENCOUNTER — Telehealth: Payer: Self-pay | Admitting: Family Medicine

## 2019-06-02 DIAGNOSIS — F411 Generalized anxiety disorder: Secondary | ICD-10-CM

## 2019-06-02 NOTE — Telephone Encounter (Signed)
Paroxetine is amongst the cheapest medications out there.  It is on the $4 list at [facility] for a 30 day supply.  Since she is taking 60mg  this may be $6 per fill at [facility].  Please inform her that it should be a lot cheaper there.

## 2019-06-02 NOTE — Telephone Encounter (Signed)
Attempted to contact patient - NA °

## 2019-06-02 NOTE — Telephone Encounter (Signed)
Pharmacy comment:  Alternative Requested: PT PAYING $29 FOR PAROX .  PT REQUESTING LOWER OUT-OF-POCKET COST.

## 2019-06-03 NOTE — Telephone Encounter (Signed)
LMTCB

## 2019-06-10 NOTE — Telephone Encounter (Signed)
Multiple attempts made to contact patient.  This encounter will now be closed  

## 2019-08-29 ENCOUNTER — Other Ambulatory Visit: Payer: Self-pay | Admitting: Family Medicine

## 2019-08-29 DIAGNOSIS — F411 Generalized anxiety disorder: Secondary | ICD-10-CM

## 2019-09-01 ENCOUNTER — Other Ambulatory Visit: Payer: Self-pay

## 2019-09-01 ENCOUNTER — Encounter: Payer: Self-pay | Admitting: Family Medicine

## 2019-09-01 ENCOUNTER — Ambulatory Visit: Payer: BC Managed Care – PPO | Admitting: Family Medicine

## 2019-09-01 VITALS — BP 116/83 | HR 72 | Temp 97.3°F | Ht 64.0 in | Wt 196.0 lb

## 2019-09-01 DIAGNOSIS — E559 Vitamin D deficiency, unspecified: Secondary | ICD-10-CM

## 2019-09-01 DIAGNOSIS — E039 Hypothyroidism, unspecified: Secondary | ICD-10-CM

## 2019-09-01 DIAGNOSIS — G479 Sleep disorder, unspecified: Secondary | ICD-10-CM

## 2019-09-01 DIAGNOSIS — Z79899 Other long term (current) drug therapy: Secondary | ICD-10-CM | POA: Diagnosis not present

## 2019-09-01 DIAGNOSIS — G47419 Narcolepsy without cataplexy: Secondary | ICD-10-CM | POA: Diagnosis not present

## 2019-09-01 DIAGNOSIS — F411 Generalized anxiety disorder: Secondary | ICD-10-CM

## 2019-09-01 DIAGNOSIS — R252 Cramp and spasm: Secondary | ICD-10-CM

## 2019-09-01 MED ORDER — LEVOTHYROXINE SODIUM 100 MCG PO TABS: 100.0000 ug | ORAL_TABLET | Freq: Every day | ORAL | 1 refills | Status: DC

## 2019-09-01 MED ORDER — PAROXETINE HCL 30 MG PO TABS: ORAL_TABLET | ORAL | 3 refills | Status: DC

## 2019-09-01 MED ORDER — MODAFINIL 200 MG PO TABS: ORAL_TABLET | ORAL | 2 refills | Status: DC

## 2019-09-01 MED ORDER — FLUTICASONE PROPIONATE 50 MCG/ACT NA SUSP: 2.0000 | Freq: Every day | NASAL | 6 refills | Status: DC

## 2019-09-01 NOTE — Patient Instructions (Signed)
You had labs performed today.  You will be contacted with the results of the labs once they are available, usually in the next 3 business days for routine lab work.  If you have an active my chart account, they will be released to your MyChart.  If you prefer to have these labs released to you via telephone, please let us know.  If you had a pap smear or biopsy performed, expect to be contacted in about 7-10 days.  Please remember to arrive to your appointment 15 minutes prior to your scheduled slot.  If you are late to future visits I may be unable to accommodate your appointment and you may be asked to reschedule.  Controlled Substance Guidelines:  1. You cannot get an early refill, even it is lost.  2. You cannot get controlled medications from any other doctor, unless it is the emergency department and related to a new problem or injury.  3. You cannot use alcohol, marijuana, cocaine or any other recreational drugs while using this medication. This is very dangerous.  4. You are willing to have your urine drug tested at each visit.  5. You will not drive while using this medication, because that can put yourself and others in serious danger of an accident. 6. If any medication is stolen, then there must be a police report to verify it, or it cannot be refilled.  7. I will not prescribe these medications for longer than 3 months.  8. You must bring your pill bottle to each visit.  9. You must use the same pharmacy for all refills for the medication, unless you clear it with me beforehand.  10. You cannot share or sell this medication.

## 2019-09-01 NOTE — Progress Notes (Signed)
Subjective: CC: Hypothyroidism, anxiety PCP: Krystal Norlander, DO OJJ:[id number] [name] is a 43 y.o. female presenting to clinic today for:  1.  Hypothyroidism Patient reports compliance with Synthroid 100 mcg daily.  No difficulty swallowing, change in voice, tremor, unplanned weight gain or loss, heart palpitations.  No change in bowel habits.  2.  Anxiety disorder Patient reports compliance with Paxil 60 mg daily.  She reports good control of both anxiety and mood.  She needs refills today.  3.  Cramping Patient reports about a 1 month history of cramping in her arms.  She notes that the left arm is worse, particularly when she goes to reach behind her back or do certain movements her hands tend to cramp up on her.  Denies any injury to neck previously.  No neck pain.  She does have intermittent swelling in the hands and feet.  She had previous autoimmune work-up which was negative.  She does not hydrate adequately and reports that she drinks several diet drinks per day.  4.  Narcolepsy Diagnosed about a year ago.  Her current sleep doctor is Dr. Elveria Mckee in [place].  She notes that he is actually a doctor out of [place] and was previously going to [place] for work.  However, he will be relocating back to [place].  She is wondering if I can take over her Provigil.  She also has history of sleep apnea and uses a CPAP at nighttime.  She notes difficulty staying asleep at nighttime but does not identify anything that specifically wakes her up.  She has been using some hydroxyzine of her son which does help some.  She is wondering if there is anything she can do for sleep.  She reports diet sodas as above.  Denies nocturia.   ROS: Per HPI  Allergies  Allergen Reactions  . Penicillins Anaphylaxis and Swelling    Has patient had a PCN reaction causing immediate rash, facial/tongue/throat swelling, SOB or lightheadedness with hypotension: Yes Has patient had a PCN reaction causing  severe rash involving mucus membranes or skin necrosis: Yes Has patient had a PCN reaction that required hospitalization No Has patient had a PCN reaction occurring within the last 10 years: No If all of the above answers are "NO", then may proceed with Cephalosporin use.   Krystal Mckee [Sertraline Hcl] Itching and Rash   Past Medical History:  Diagnosis Date  . Anxiety   . Colon polyps   . Depression   . Hypothyroidism   . Sleep apnea     Current Outpatient Medications:  .  fluticasone (FLONASE) 50 MCG/ACT nasal spray, Place 2 sprays into both nostrils daily., Disp: 16 g, Rfl: 6 .  levothyroxine (SYNTHROID) 100 MCG tablet, Take 1 tablet (100 mcg total) by mouth daily., Disp: 90 tablet, Rfl: 1 .  modafinil (PROVIGIL) 200 MG tablet, TAKE 1 OR 2 TABLETS BY MOUTH EVERY MORNING, Disp: 60 tablet, Rfl: 2 .  PARoxetine (PAXIL) 30 MG tablet, Take two tablets by mouth every morning., Disp: 180 tablet, Rfl: 3 .  Vitamin D, Ergocalciferol, (DRISDOL) 1.25 MG (50000 UT) CAPS capsule, TAKE 1 CAPSULE (50,000 UNITS TOTAL) BY MOUTH EVERY 7 (SEVEN) DAYS., Disp: 8 capsule, Rfl: 0 Social History   Socioeconomic History  . Marital status: Single    Spouse name: Not on file  . Number of children: 3  . Years of education: college  . Highest education level: Not on file  Occupational History    Employer: [employer]  Social Needs  . Financial resource strain: Not on file  . Food insecurity    Worry: Not on file    Inability: Not on file  . Transportation needs    Medical: Not on file    Non-medical: Not on file  Tobacco Use  . Smoking status: Current Some Day Smoker    Packs/day: 0.25    Years: 5.00    Pack years: 1.25    Types: Cigarettes  . Smokeless tobacco: Never Used  Substance and Sexual Activity  . Alcohol use: No  . Drug use: No  . Sexual activity: Yes    Birth control/protection: Other-see comments    Comment: [facility] 2001  Lifestyle  . Physical activity    Days per week: Not on file     Minutes per session: Not on file  . Stress: Not on file  Relationships  . Social Musicianconnections    Talks on phone: Not on file    Gets together: Not on file    Attends religious service: Not on file    Active member of club or organization: Not on file    Attends meetings of clubs or organizations: Not on file    Relationship status: Not on file  . Intimate partner violence    Fear of current or ex partner: Not on file    Emotionally abused: Not on file    Physically abused: Not on file    Forced sexual activity: Not on file  Other Topics Concern  . Not on file  Social History Narrative  . Not on file   Family History  Problem Relation Age of Onset  . Colon polyps Father   . Hepatitis C Father   . Ovarian cancer Mother   . Diabetes Maternal Grandmother   . Colon cancer Neg Hx   . Pancreatic cancer Neg Hx   . Stomach cancer Neg Hx   . Esophageal cancer Neg Hx     Objective: Office vital signs reviewed. BP 116/83   Pulse 72   Temp (!) 97.3 F (36.3 C) (Temporal)   Ht 5\' 4"  (1.626 Krystal)   Wt 196 lb (88.9 kg)   BMI 33.64 kg/Krystal   Physical Examination:  General: Awake, alert, No acute distress HEENT: Normal; no exophthalmos.  No goiter.  Thyroid not enlarged. Cardio: regular rate and rhythm, S1S2 heard, no murmurs appreciated Pulm: clear to auscultation bilaterally, no wheezes, rhonchi or rales; normal work of breathing on room air Extremities: warm, well perfused, No edema, cyanosis or clubbing; +2 pulses bilaterally MSK: normal gait and station Skin: dry; intact; no rashes or lesions Psych: Mood stable.  She seems somewhat scattered/distracted during today's visit.  Does not appear to be responding to internal stimuli. [facility] 09/01/2019 01/12/2019 09/16/2018  Decreased Interest 0 0 0  Down, Depressed, Hopeless 0 0 0  PHQ - 2 Score 0 0 0  Altered sleeping 2 0 -  Tired, decreased energy 1 1 -  Change in appetite 0 0 -  Feeling bad or failure about  yourself  0 0 -  Trouble concentrating 1 1 -  Moving slowly or fidgety/restless 0 0 -  Suicidal thoughts 0 0 -  PHQ-9 Score 4 2 -  Difficult doing work/chores - - -  Some recent data might be hidden   GAD 7 : Generalized Anxiety Score 09/01/2019 01/12/2019 09/25/2016  Nervous, Anxious, on Edge 0 0 3  Control/stop worrying 0 1 3  Worry too much -  different things 0 1 3  Trouble relaxing 0 0 3  Restless 0 0 2  Easily annoyed or irritable 0 1 3  Afraid - awful might happen 0 0 3  Total GAD 7 Score 0 3 20  Anxiety Difficulty - Not difficult at all Very difficult    Assessment/ Plan: 43 y.o. female   1. Hypothyroidism, unspecified type Asymptomatic.  Check thyroid panel. - Thyroid Panel With TSH - levothyroxine (SYNTHROID) 100 MCG tablet; Take 1 tablet (100 mcg total) by mouth daily.  Dispense: 90 tablet; Refill: 1  2. Vitamin D deficiency Check vitamin D level.  She is status post 12 weeks of vitamin D replacement earlier this year. 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  I would be glad to take over this medication but low threshold to refer her back to her sleep doctor should symptoms no longer respond to prescribed medicine.  I reviewed the national narcotic database and there were no red flags.  It appears that she takes 1 tablet daily most days as refills are separated by a couple of months.  Controlled substance contract signed.  UDS obtained. - DRUG SCREEN-TOXASSURE - modafinil (PROVIGIL) 200 MG tablet; TAKE 1 OR 2 TABLETS BY MOUTH EVERY MORNING  Dispense: 60 tablet; Refill: 2  4. Controlled substance agreement signed - DRUG SCREEN-TOXASSURE  5. GAD (generalized anxiety disorder) Stable.  Paxil renewed - PARoxetine (PAXIL) 30 MG tablet; Take two tablets by mouth every morning.  Dispense: 180 tablet; Refill: 3  6. Cramps, muscle, general Uncertain etiology.  We discussed  adequate hydration.  I will check for electrolyte abnormalities and vitamin deficiencies that are possibly contributing.  We discussed if this work-up is negative we may have her see an orthopedist to make sure it is not coming from her neck. - Ferritin - Magnesium  7. Sleep disturbance Advised to reduce use of diet sodas containing caffeine.  Consider melatonin with max dose being 10 mg per night.  If no significant improvement, could consider addition of hydroxyzine.  Though she may need to consider taking her Provigil earlier in the day so that this is not affecting sleep.  It sounds like is not necessarily difficulty getting sleep but rather staying asleep.  Sleep hygiene reinforced.    Orders Placed This Encounter  Procedures  . Thyroid Panel With TSH  . Vitamin D 25 hydroxy  . DRUG SCREEN-TOXASSURE  . Ferritin  . Magnesium   Meds ordered this encounter  Medications  . fluticasone (FLONASE) 50 MCG/ACT nasal spray    Sig: Place 2 sprays into both nostrils daily.    Dispense:  16 g    Refill:  6  . levothyroxine (SYNTHROID) 100 MCG tablet    Sig: Take 1 tablet (100 mcg total) by mouth daily.    Dispense:  90 tablet    Refill:  1  . PARoxetine (PAXIL) 30 MG tablet    Sig: Take two tablets by mouth every morning.    Dispense:  180 tablet    Refill:  3  . modafinil (PROVIGIL) 200 MG tablet    Sig: TAKE 1 OR 2 TABLETS BY MOUTH EVERY MORNING    Dispense:  60 tablet    Refill:  2     Rasaan Brotherton Hulen Skains, DO [facility] [phone]

## 2019-09-02 ENCOUNTER — Other Ambulatory Visit: Payer: Self-pay

## 2019-09-02 ENCOUNTER — Other Ambulatory Visit: Payer: Self-pay | Admitting: Family Medicine

## 2019-09-02 DIAGNOSIS — R7989 Other specified abnormal findings of blood chemistry: Secondary | ICD-10-CM

## 2019-09-02 DIAGNOSIS — E559 Vitamin D deficiency, unspecified: Secondary | ICD-10-CM

## 2019-09-02 LAB — THYROID PANEL WITH TSH
Free Thyroxine Index: 1.8 (ref 1.2–4.9)
T3 Uptake Ratio: 24 % (ref 24–39)
T4, Total: 7.3 ug/dL (ref 4.5–12.0)
TSH: 6.42 u[IU]/mL — ABNORMAL HIGH (ref 0.450–4.500)

## 2019-09-02 LAB — MAGNESIUM: Magnesium: 2.1 mg/dL (ref 1.6–2.3)

## 2019-09-02 LAB — FERRITIN: Ferritin: 204 ng/mL — ABNORMAL HIGH (ref 15–150)

## 2019-09-02 LAB — VITAMIN D 25 HYDROXY (VIT D DEFICIENCY, FRACTURES): Vit D, 25-Hydroxy: 15.5 ng/mL — ABNORMAL LOW (ref 30.0–100.0)

## 2019-09-02 MED ORDER — LEVOTHYROXINE SODIUM 112 MCG PO TABS: 112.0000 ug | ORAL_TABLET | Freq: Every day | ORAL | 0 refills | Status: DC

## 2019-09-02 MED ORDER — CHOLECALCIFEROL 1.25 MG (50000 UT) PO CAPS: 50000.0000 [IU] | ORAL_CAPSULE | ORAL | 0 refills | Status: AC

## 2019-09-04 LAB — TOXASSURE SELECT 13 (MW), URINE

## 2019-10-29 ENCOUNTER — Other Ambulatory Visit: Payer: Self-pay

## 2019-10-29 ENCOUNTER — Ambulatory Visit (INDEPENDENT_AMBULATORY_CARE_PROVIDER_SITE_OTHER)
Admission: RE | Admit: 2019-10-29 | Discharge: 2019-10-29 | Disposition: A | Discharge status: Home / Self Care | Payer: BC Managed Care – PPO | Source: Ambulatory Visit

## 2019-10-29 DIAGNOSIS — J0100 Acute maxillary sinusitis, unspecified: Secondary | ICD-10-CM

## 2019-10-29 MED ORDER — AZITHROMYCIN 250 MG PO TABS: 250.0000 mg | ORAL_TABLET | Freq: Every day | ORAL | 0 refills | Status: DC

## 2019-10-29 NOTE — Discharge Instructions (Signed)
Take the antibiotic as directed.  Continue to use your Flonase.  Additionally you can take over-the-counter Mucinex and ibuprofen as needed.    Follow-up with your primary care provider if your symptoms are not improving.

## 2019-10-29 NOTE — ED Provider Notes (Signed)
Virtual Visit via Video Note:  Krystal Mckee  initiated request for Telemedicine visit with [facility] team. I connected with [name]  on 10/29/2019 at 10:07 AM  for a synchronized telemedicine visit using a video enabled HIPPA compliant telemedicine application. I verified that I am speaking with [name]  using two identifiers. Sharion Balloon, NP  was physically located in a [facility] Urgent care site and ELLIE BRYAND was located at a different location.   The limitations of evaluation and management by telemedicine as well as the availability of in-person appointments were discussed. Patient was informed that she  may incur a bill ( including co-pay) for this virtual visit encounter. [name]  expressed understanding and gave verbal consent to proceed with virtual visit.     History of Present Illness:Krystal Mckee  is a 44 y.o. female presents for evaluation of 4 day history of sinus headache, ear pain, maxillary sinus pressure.  Attempted treatment at home with Flonase.  Denies fever, chills, sore throat, cough, shortness of breath, vomiting, diarrhea, or rash.     Allergies  Allergen Reactions  . Penicillins Anaphylaxis and Swelling    Has patient had a PCN reaction causing immediate rash, facial/tongue/throat swelling, SOB or lightheadedness with hypotension: Yes Has patient had a PCN reaction causing severe rash involving mucus membranes or skin necrosis: Yes Has patient had a PCN reaction that required hospitalization No Has patient had a PCN reaction occurring within the last 10 years: No If all of the above answers are "NO", then may proceed with Cephalosporin use.   [place] [Sertraline Hcl] Itching and Rash     Past Medical History:  Diagnosis Date  . Anxiety   . Colon polyps   . Depression   . Hypothyroidism   . Sleep apnea      Social History   Tobacco Use  . Smoking status: Current Some Day  Smoker    Packs/day: 0.25    Years: 5.00    Pack years: 1.25    Types: Cigarettes  . Smokeless tobacco: Never Used  Substance Use Topics  . Alcohol use: No  . Drug use: No        Observations/Objective: Physical Exam  VITALS: Patient denies fever. GENERAL: Alert, appears well and in no acute distress. HEENT: Atraumatic. NECK: Normal movements of the head and neck. CARDIOPULMONARY: No increased WOB. Speaking in clear sentences. I:E ratio WNL.  MS: Moves all visible extremities without noticeable abnormality. PSYCH: Pleasant and cooperative, well-groomed. Speech normal rate and rhythm. Affect is appropriate. Insight and judgement are appropriate. Attention is focused, linear, and appropriate.  NEURO: CN grossly intact. Oriented as arrived to appointment on time with no prompting. Moves both UE equally.  SKIN: No obvious lesions, wounds, erythema, or cyanosis noted on face or hands.   Assessment and Plan:    ICD-10-CM   1. Acute non-recurrent maxillary sinusitis  J01.00        Follow Up Instructions: Treating with Zithromax.  Instructed patient to continue using Flonase.  Instructed her to take Mucinex and ibuprofen as needed.  Instructed her to follow-up with her PCP if her symptoms are not improving.      I discussed the assessment and treatment plan with the patient. The patient was provided an opportunity to ask questions and all were answered. The patient agreed with the plan and demonstrated an understanding of the instructions.   The patient was advised to  call back or seek an in-person evaluation if the symptoms worsen or if the condition fails to improve as anticipated.      Mickie Bail, NP  10/29/2019 10:07 AM         Mickie Bail, NP 10/29/19 1007

## 2019-12-05 ENCOUNTER — Other Ambulatory Visit: Payer: Self-pay | Admitting: Family Medicine

## 2019-12-09 ENCOUNTER — Telehealth: Payer: Self-pay | Admitting: *Deleted

## 2019-12-09 NOTE — Telephone Encounter (Signed)
Prior Auth for Modafinil 200mg -In Process  Key: -   PA Case ID: [id number]   Your information has been submitted to [employer]. To check for an updated outcome later, reopen this PA request from your dashboard.  If [employer] has not responded to your request within 24 hours, contact [employer] at [phone]. If you think there may be a problem with your PA request, use our live chat feature at the bottom right.

## 2019-12-10 NOTE — Telephone Encounter (Signed)
Prior Auth for Modafinil 200mg -APPROVED till 12/08/20  Pharmacy notified

## 2020-02-09 ENCOUNTER — Encounter: Payer: Self-pay | Admitting: *Deleted

## 2020-02-09 ENCOUNTER — Other Ambulatory Visit: Payer: Self-pay | Admitting: Family Medicine

## 2020-02-09 NOTE — Telephone Encounter (Signed)
[place]. NTBS 30 days given 12/06/19

## 2020-03-17 ENCOUNTER — Other Ambulatory Visit: Payer: Self-pay | Admitting: Family Medicine

## 2020-03-17 DIAGNOSIS — G47419 Narcolepsy without cataplexy: Secondary | ICD-10-CM

## 2020-04-14 ENCOUNTER — Telehealth (INDEPENDENT_AMBULATORY_CARE_PROVIDER_SITE_OTHER): Payer: BC Managed Care – PPO | Admitting: Nurse Practitioner

## 2020-04-14 DIAGNOSIS — R634 Abnormal weight loss: Secondary | ICD-10-CM

## 2020-04-14 DIAGNOSIS — R197 Diarrhea, unspecified: Secondary | ICD-10-CM | POA: Diagnosis not present

## 2020-04-14 NOTE — Progress Notes (Addendum)
   Virtual Visit via video Note Due to COVID-19 pandemic this visit was conducted virtually. This visit type was conducted due to national recommendations for restrictions regarding the COVID-19 Pandemic (e.g. social distancing, sheltering in place) in an effort to limit this patient's exposure and mitigate transmission in our community. All issues noted in this document were discussed and addressed.  A physical exam was not performed with this format.  I connected with Krystal Mckee on 04/14/20 at 10:45 by video and verified that I am speaking with the correct person using two identifiers. Krystal Mckee is currently located at home and no one is currently with her during visit. The provider, Mary-Margaret Daphine Deutscher, FNP is located in their office at time of visit.  I discussed the limitations, risks, security and privacy concerns of performing an evaluation and management service by telephone and the availability of in person appointments. I also discussed with the patient that there may be a patient responsible charge related to this service. The patient expressed understanding and agreed to proceed.   History and Present Illness:   Chief Complaint: Diarrhea   HPI Patient c/o diarrhea immediately after eating for about 1 month now. She has not taken anything to try to stop the diarrhea. She says she has lost 4 lbs since it started. She has slight nausea bit no vomiting.    Review of Systems  Constitutional: Positive for weight loss. Negative for chills, fever and malaise/fatigue.  Respiratory: Negative.   Cardiovascular: Negative.   Gastrointestinal: Positive for diarrhea and nausea. Negative for abdominal pain, constipation and vomiting.  Genitourinary: Negative.   Neurological: Negative.   Psychiatric/Behavioral: Negative.   All other systems reviewed and are negative.    Observations/Objective: Alert and oriented- answers all questions appropriately No  distress    Assessment and Plan: Krystal Mckee in today with chief complaint of Diarrhea   1. Diarrhea, unspecified type Imodium OTC Increase fiber in diet Probiotic OTC may help Orders Placed This Encounter  Procedures  . Cdiff NAA+O+P+Stool Culture     2. Weight loss Force fluids     Follow Up Instructions: prn    I discussed the assessment and treatment plan with the patient. The patient was provided an opportunity to ask questions and all were answered. The patient agreed with the plan and demonstrated an understanding of the instructions.   The patient was advised to call back or seek an in-person evaluation if the symptoms worsen or if the condition fails to improve as anticipated.  The above assessment and management plan was discussed with the patient. The patient verbalized understanding of and has agreed to the management plan. Patient is aware to call the clinic if symptoms persist or worsen. Patient is aware when to return to the clinic for a follow-up visit. Patient educated on when it is appropriate to go to the emergency department.   Time call ended:  11:00  I provided 20 minutes of non-face-to-face time during this encounter.    Mary-Margaret Daphine Deutscher, FNP

## 2020-04-21 ENCOUNTER — Other Ambulatory Visit: Payer: BC Managed Care – PPO

## 2020-04-26 LAB — CDIFF NAA+O+P+STOOL CULTURE
E coli, Shiga toxin Assay: NEGATIVE
Toxigenic C. Difficile by PCR: NEGATIVE

## 2020-06-07 ENCOUNTER — Other Ambulatory Visit: Payer: Self-pay

## 2020-06-07 ENCOUNTER — Ambulatory Visit (INDEPENDENT_AMBULATORY_CARE_PROVIDER_SITE_OTHER): Payer: BC Managed Care – PPO | Admitting: Family Medicine

## 2020-06-07 ENCOUNTER — Encounter: Payer: Self-pay | Admitting: Family Medicine

## 2020-06-07 VITALS — BP 114/75 | HR 100 | Temp 97.1°F | Ht 64.0 in | Wt 201.0 lb

## 2020-06-07 DIAGNOSIS — Z79899 Other long term (current) drug therapy: Secondary | ICD-10-CM

## 2020-06-07 DIAGNOSIS — E559 Vitamin D deficiency, unspecified: Secondary | ICD-10-CM | POA: Diagnosis not present

## 2020-06-07 DIAGNOSIS — F411 Generalized anxiety disorder: Secondary | ICD-10-CM | POA: Diagnosis not present

## 2020-06-07 DIAGNOSIS — G47419 Narcolepsy without cataplexy: Secondary | ICD-10-CM

## 2020-06-07 DIAGNOSIS — E039 Hypothyroidism, unspecified: Secondary | ICD-10-CM | POA: Diagnosis not present

## 2020-06-07 DIAGNOSIS — M7989 Other specified soft tissue disorders: Secondary | ICD-10-CM

## 2020-06-07 DIAGNOSIS — R252 Cramp and spasm: Secondary | ICD-10-CM

## 2020-06-07 MED ORDER — MODAFINIL 200 MG PO TABS: 100.0000 mg | ORAL_TABLET | Freq: Every day | ORAL | 5 refills | Status: DC

## 2020-06-07 MED ORDER — HYDROXYZINE HCL 25 MG PO TABS: 25.0000 mg | ORAL_TABLET | Freq: Every evening | ORAL | 5 refills | Status: DC | PRN

## 2020-06-07 MED ORDER — PAROXETINE HCL 30 MG PO TABS: ORAL_TABLET | ORAL | 3 refills | Status: DC

## 2020-06-07 NOTE — Progress Notes (Signed)
° °Subjective: °CC: Hypothyroidism, anxiety °PCP: Gottschalk, Ashly M, DO °HPI:Krystal Mckee is a 44 y.o. female presenting to clinic today for: ° °1.  Hypothyroidism °Patient reports compliance with Synthroid 112 mcg daily. Mother with known Hashimoto's disease. She like to be tested for this today.  Has had fatigability, constipation, cold intolerance. Difficulty with losing weight. ° °2.  Anxiety disorder °Compliant with Paxil. Would like refills. Anxiety disorder stable ° °3.  Narcolepsy/ OSA °Sleep doctor is Dr. Bradley Vaughn in [place].  OSA on CPAP, Provigil ° °She takes 1/2-1 full tablet of Provigil daily as needed. On the days that she is able to stay home and sleep she does not take the medicine because she often has quite a bit of difficulty falling asleep at nighttime otherwise. She has taken Atarax and this has helped with sleep. She would like a renewal on this medicine. ° °4. Hand cramping °Patient reports hand cramping, particularly after she has done fine motor activities. She points to her MCPs as the main area of pain and states that she actually will have weakness after washing several dishes in the hand such that she drops the plates. ° ° °ROS: Per HPI ° °Allergies  °Allergen Reactions  °• Penicillins Anaphylaxis and Swelling  °  Has patient had a PCN reaction causing immediate rash, facial/tongue/throat swelling, SOB or lightheadedness with hypotension: Yes °Has patient had a PCN reaction causing severe rash involving mucus membranes or skin necrosis: Yes °Has patient had a PCN reaction that required hospitalization No °Has patient had a PCN reaction occurring within the last 10 years: No °If all of the above answers are "NO", then may proceed with Cephalosporin use. °  °• Zoloft [Sertraline Hcl] Itching and Rash  ° °Past Medical History:  °Diagnosis Date  °• Anxiety   °• Colon polyps   °• Depression   °• Hypothyroidism   °• Sleep apnea   ° ° °Current Outpatient Medications:  °•   azithromycin (ZITHROMAX) 250 MG tablet, Take 1 tablet (250 mg total) by mouth daily. Take first 2 tablets together, then 1 every day until finished., Disp: 6 tablet, Rfl: 0 °•  fluticasone (FLONASE) 50 MCG/ACT nasal spray, Place 2 sprays into both nostrils daily., Disp: 16 g, Rfl: 6 °•  levothyroxine (SYNTHROID) 112 MCG tablet, Take 1 tablet (112 mcg total) by mouth daily. Needs to be seen for further refills., Disp: 30 tablet, Rfl: 0 °•  modafinil (PROVIGIL) 200 MG tablet, TAKE 1 OR 2 TABLETS BY MOUTH EVERY MORNING, Disp: 60 tablet, Rfl: 2 °•  PARoxetine (PAXIL) 30 MG tablet, Take two tablets by mouth every morning., Disp: 180 tablet, Rfl: 3 °•  Vitamin D, Ergocalciferol, (DRISDOL) 1.25 MG (50000 UT) CAPS capsule, TAKE 1 CAPSULE (50,000 UNITS TOTAL) BY MOUTH EVERY 7 (SEVEN) DAYS., Disp: 8 capsule, Rfl: 0 °Social History  ° °Socioeconomic History  °• Marital status: Single  °  Spouse name: Not on file  °• Number of children: 3  °• Years of education: college  °• Highest education level: Not on file  °Occupational History  °  Employer: [employer]  °Tobacco Use  °• Smoking status: Current Some Day Smoker  °  Packs/day: 0.25  °  Years: 5.00  °  Pack years: 1.25  °  Types: Cigarettes  °• Smokeless tobacco: Never Used  °Vaping Use  °• Vaping Use: Never used  °Substance and Sexual Activity  °• Alcohol use: No  °• Drug use: No  °• Sexual activity: Yes  °    Birth control/protection: Other-see comments  °  Comment: [facility] 2001  °Other Topics Concern  °• Not on file  °Social History Narrative  °• Not on file  ° °Social Determinants of Health  ° °Financial Resource Strain:   °• Difficulty of Paying Living Expenses:   °Food Insecurity:   °• Worried About Running Out of Food in the Last Year:   °• Ran Out of Food in the Last Year:   °Transportation Needs:   °• Lack of Transportation (Medical):   °• Lack of Transportation (Non-Medical):   °Physical Activity:   °• Days of Exercise per Week:   °• Minutes of Exercise per Session:   °Stress:    °• Feeling of Stress :   °Social Connections:   °• Frequency of Communication with Friends and Family:   °• Frequency of Social Gatherings with Friends and Family:   °• Attends Religious Services:   °• Active Member of Clubs or Organizations:   °• Attends Club or Organization Meetings:   °• Marital Status:   °Intimate Partner Violence:   °• Fear of Current or Ex-Partner:   °• Emotionally Abused:   °• Physically Abused:   °• Sexually Abused:   ° °Family History  °Problem Relation Age of Onset  °• Colon polyps Father   °• Hepatitis C Father   °• Ovarian cancer Mother   °• Diabetes Maternal Grandmother   °• Colon cancer Neg Hx   °• Pancreatic cancer Neg Hx   °• Stomach cancer Neg Hx   °• Esophageal cancer Neg Hx   ° ° °Objective: °Office vital signs reviewed. °BP 114/75    Pulse 100    Temp (!) 97.1 °F (36.2 °C) (Temporal)    Ht 5' 4" (1.626 m)    Wt 201 lb (91.2 kg)    SpO2 94%    BMI 34.50 kg/m²  ° °Physical Examination:  °General: Awake, alert, No acute distress °HEENT: Normal; no exophthalmos.  No goiter.    °Cardio: regular rate and rhythm, S1S2 heard, no murmurs appreciated °Pulm: clear to auscultation bilaterally, no wheezes, rhonchi or rales; normal work of breathing on room air °Extremities: warm, well perfused, mild nonpitting edema of hands, No cyanosis or clubbing; +2 pulses bilaterally °MSK: normal gait and station °Skin: dry; intact; no rashes or lesions °Psych: Mood stable. Thought process linear. Pleasant and interactive. Does not appear to be responding to internal stimuli °Depression screen PHQ 2/9 09/01/2019 01/12/2019 09/16/2018  °Decreased Interest 0 0 0  °Down, Depressed, Hopeless 0 0 0  °PHQ - 2 Score 0 0 0  °Altered sleeping 2 0 -  °Tired, decreased energy 1 1 -  °Change in appetite 0 0 -  °Feeling bad or failure about yourself  0 0 -  °Trouble concentrating 1 1 -  °Moving slowly or fidgety/restless 0 0 -  °Suicidal thoughts 0 0 -  °PHQ-9 Score 4 2 -  °Difficult doing work/chores - - -  °Some  recent data might be hidden  ° °GAD 7 : Generalized Anxiety Score 09/01/2019 01/12/2019 09/25/2016  °Nervous, Anxious, on Edge 0 0 3  °Control/stop worrying 0 1 3  °Worry too much - different things 0 1 3  °Trouble relaxing 0 0 3  °Restless 0 0 2  °Easily annoyed or irritable 0 1 3  °Afraid - awful might happen 0 0 3  °Total GAD 7 Score 0 3 20  °Anxiety Difficulty - Not difficult at all Very difficult  ° ° °Assessment/ Plan: °  44 y.o. female   1. Hypothyroidism, unspecified type Check thyroid panel, will check for autoantibodies given mother's history of Hashimoto. We discussed the likelihood would be very high and would likely not change our treatment plan but would be for more informational purposes. - Thyroid Panel With TSH - CMP14+EGFR - Thyroid Peroxidase Antibody - Thyroglobulin antibody  2. Primary narcolepsy without cataplexy Stable. I have changed her prescription to reflect current use. The national narcotic database was reviewed and there were no red flags. She is up-to-date on CSC and UDS. - CMP14+EGFR - modafinil (PROVIGIL) 200 MG tablet; Take 0.5-1 tablets (100-200 mg total) by mouth daily. TAKE 1 OR 2 TABLETS BY MOUTH EVERY MORNING  Dispense: 30 tablet; Refill: 5  3. GAD (generalized anxiety disorder) Stable. Paxil need - CMP14+EGFR - PARoxetine (PAXIL) 30 MG tablet; Take two tablets by mouth every morning.  Dispense: 180 tablet; Refill: 3  4. Vitamin D deficiency Check vitamin D level given deficiency last visit - VITAMIN D 25 Hydroxy (Vit-D Deficiency, Fractures)  5. Controlled substance agreement signed - ToxASSURE Select 13 (MW), Urine  6. Swelling of both hands Check ANA, CRP and sed rate. She had previous negative RF. - ANA w/Reflex if Positive - C-reactive protein - Sedimentation Rate  7. Cramping of hands Check mag level - Magnesium   No orders of the defined types were placed in this encounter.  No orders of the defined types were placed in this  encounter.    Janora Norlander, DO [facility] [phone]

## 2020-06-09 LAB — CMP14+EGFR
ALT: 22 IU/L (ref 0–32)
AST: 19 IU/L (ref 0–40)
Albumin/Globulin Ratio: 1.8 (ref 1.2–2.2)
Albumin: 4.7 g/dL (ref 3.8–4.8)
Alkaline Phosphatase: 74 IU/L (ref 48–121)
BUN/Creatinine Ratio: 10 (ref 9–23)
BUN: 11 mg/dL (ref 6–24)
Bilirubin Total: 0.9 mg/dL (ref 0.0–1.2)
CO2: 23 mmol/L (ref 20–29)
Calcium: 9.8 mg/dL (ref 8.7–10.2)
Chloride: 97 mmol/L (ref 96–106)
Creatinine, Ser: 1.05 mg/dL — ABNORMAL HIGH (ref 0.57–1.00)
GFR calc Af Amer: 75 mL/min/{1.73_m2} (ref >59)
GFR calc non Af Amer: 65 mL/min/{1.73_m2} (ref >59)
Globulin, Total: 2.6 g/dL (ref 1.5–4.5)
Glucose: 99 mg/dL (ref 65–99)
Potassium: 4.2 mmol/L (ref 3.5–5.2)
Sodium: 136 mmol/L (ref 134–144)
Total Protein: 7.3 g/dL (ref 6.0–8.5)

## 2020-06-09 LAB — C-REACTIVE PROTEIN: CRP: 3 mg/L (ref 0–10)

## 2020-06-09 LAB — SEDIMENTATION RATE: Sed Rate: 11 mm/hr (ref 0–32)

## 2020-06-09 LAB — THYROID PEROXIDASE ANTIBODY: Thyroperoxidase Ab SerPl-aCnc: 20 IU/mL (ref 0–34)

## 2020-06-09 LAB — THYROID PANEL WITH TSH
Free Thyroxine Index: 2.1 (ref 1.2–4.9)
T3 Uptake Ratio: 25 % (ref 24–39)
T4, Total: 8.4 ug/dL (ref 4.5–12.0)
TSH: 3.19 u[IU]/mL (ref 0.450–4.500)

## 2020-06-09 LAB — THYROGLOBULIN ANTIBODY: Thyroglobulin Antibody: <1 IU/mL (ref 0.0–0.9)

## 2020-06-09 LAB — MAGNESIUM: Magnesium: 1.9 mg/dL (ref 1.6–2.3)

## 2020-06-09 LAB — VITAMIN D 25 HYDROXY (VIT D DEFICIENCY, FRACTURES): Vit D, 25-Hydroxy: 20.3 ng/mL — ABNORMAL LOW (ref 30.0–100.0)

## 2020-06-09 LAB — ANA W/REFLEX IF POSITIVE: Anti Nuclear Antibody (ANA): NEGATIVE

## 2020-06-10 LAB — TOXASSURE SELECT 13 (MW), URINE

## 2020-06-20 ENCOUNTER — Encounter: Payer: Self-pay | Admitting: Family Medicine

## 2020-06-20 ENCOUNTER — Other Ambulatory Visit: Payer: Self-pay | Admitting: Family Medicine

## 2020-06-21 ENCOUNTER — Other Ambulatory Visit: Payer: Self-pay | Admitting: Family Medicine

## 2020-06-21 ENCOUNTER — Encounter: Payer: Self-pay | Admitting: Family Medicine

## 2020-06-21 MED ORDER — LEVOTHYROXINE SODIUM 112 MCG PO TABS
112.0000 ug | ORAL_TABLET | Freq: Every day | ORAL | 1 refills | Status: DC
Start: 2020-06-21 — End: 2020-12-20

## 2020-07-12 ENCOUNTER — Telehealth: Payer: BC Managed Care – PPO | Admitting: Nurse Practitioner

## 2020-07-12 ENCOUNTER — Encounter: Payer: Self-pay | Admitting: Family Medicine

## 2020-07-12 DIAGNOSIS — H9209 Otalgia, unspecified ear: Secondary | ICD-10-CM

## 2020-07-12 NOTE — Progress Notes (Signed)
Based on what you shared with me it looks like you have ear issues,that should be evaluated in a face to face office visit. The white sac is your ear drum with probably a little fluid behind it. flonase nasal spray OTC will help to get that fluid out. Also it is not uncommon to get a scratch in your ear canal, especially if you have stuck a q tip or anything in it, including your finger.  Many offices offer virtual options to be seen via video if you are not comfortable going in person to a medical facility at this time.  If you do not have a PCP, [facility] offers a free physician referral service available at [phone]. Our trained staff has the experience, knowledge and resources to put you in touch with a physician who is right for you.   You also have the option of a video visit through [email or web address]  If you are having a true medical emergency please call 911.  NOTE: If you entered your credit card information for this eVisit, you will not be charged. You may see a "hold" on your card for the $35 but that hold will drop off and you will not have a charge processed.  Your e-visit answers were reviewed by a board certified advanced clinical practitioner to complete your personal care plan.  Thank you for using e-Visits.

## 2020-08-29 ENCOUNTER — Encounter: Payer: Self-pay | Admitting: Family Medicine

## 2020-12-20 ENCOUNTER — Other Ambulatory Visit: Payer: Self-pay | Admitting: Family Medicine

## 2021-01-03 ENCOUNTER — Other Ambulatory Visit: Payer: Self-pay | Admitting: Family Medicine

## 2021-02-24 ENCOUNTER — Other Ambulatory Visit: Payer: Self-pay | Admitting: Family Medicine

## 2021-02-24 DIAGNOSIS — G47419 Narcolepsy without cataplexy: Secondary | ICD-10-CM

## 2021-05-15 ENCOUNTER — Telehealth: Payer: BC Managed Care – PPO | Admitting: Physician Assistant

## 2021-05-15 DIAGNOSIS — U071 COVID-19: Secondary | ICD-10-CM

## 2021-05-15 MED ORDER — MOLNUPIRAVIR EUA 200MG CAPSULE: 4.0000 | ORAL_CAPSULE | Freq: Two times a day (BID) | ORAL | 0 refills | Status: AC

## 2021-05-15 NOTE — Progress Notes (Signed)
Virtual Visit Consent   Krystal Mckee, you are scheduled for a virtual visit with a [facility] provider today.     Just as with appointments in the office, your consent must be obtained to participate.  Your consent will be active for this visit and any virtual visit you may have with one of our providers in the next 365 days.     If you have a MyChart account, a copy of this consent can be sent to you electronically.  All virtual visits are billed to your insurance company just like a traditional visit in the office.    As this is a virtual visit, video technology does not allow for your provider to perform a traditional examination.  This may limit your provider's ability to fully assess your condition.  If your provider identifies any concerns that need to be evaluated in person or the need to arrange testing (such as labs, EKG, etc.), we will make arrangements to do so.     Although advances in technology are sophisticated, we cannot ensure that it will always work on either your end or our end.  If the connection with a video visit is poor, the visit may have to be switched to a telephone visit.  With either a video or telephone visit, we are not always able to ensure that we have a secure connection.     I need to obtain your verbal consent now.   Are you willing to proceed with your visit today?    Krystal Mckee has provided verbal consent on 05/15/2021 for a virtual visit (video or telephone).   Piedad Climes, [place]   Date: 05/15/2021 11:36 AM   Virtual Visit via Video Note   I, Piedad Climes, connected with  Krystal Mckee  ([id number], [birth year]) on 05/15/21 at 11:30 AM EDT by a video-enabled telemedicine application and verified that I am speaking with the correct person using two identifiers.  Location: Patient: Virtual Visit Location Patient: Home Provider: Virtual Visit Location Provider: Home Office   I discussed the limitations of  evaluation and management by telemedicine and the availability of in person appointments. The patient expressed understanding and agreed to proceed.    History of Present Illness: Krystal Mckee is a 45 y.o. who identifies as a female who was assigned female at birth, and is being seen today for COVID-19. Patient endorses symptoms starting Sunday afternoon with significant malaise and fatigue. Monday developed nasal congestion, sinus pressure, headache and cough with fever (Tmax 102.5). Took a home COVID test which was +. Notes she has been taking Tylenol to help with fever and headache. Notes helps with fever but headache is still significant. Denies diplopia, vomiting, AMS. Denies any chest pain or SOB.   HPI: HPI  Problems:  Patient Active Problem List   Diagnosis Date Noted   History of colonic polyps 09/23/2018   Chronic constipation 09/23/2018   Obesity (BMI 30-39.9) 03/15/2016   Hyperlipidemia 08/31/2015   Vitamin D deficiency 02/07/2015   Hypothyroidism 05/31/2014   Depression 05/31/2014   GAD (generalized anxiety disorder) 05/31/2014   Syncope 02/18/2013   Dyspareunia 01/12/2013   IBS (irritable colon syndrome) 01/12/2013    Allergies:  Allergies  Allergen Reactions   Penicillins Anaphylaxis and Swelling    Has patient had a PCN reaction causing immediate rash, facial/tongue/throat swelling, SOB or lightheadedness with hypotension: Yes Has patient had a PCN reaction causing severe rash involving mucus membranes or skin necrosis: Yes  Has patient had a PCN reaction that required hospitalization No Has patient had a PCN reaction occurring within the last 10 years: No If all of the above answers are "NO", then may proceed with Cephalosporin use.    Zoloft [Sertraline Hcl] Itching and Rash   Medications:  Current Outpatient Medications:    fluticasone (FLONASE) 50 MCG/ACT nasal spray, Place 2 sprays into both nostrils daily., Disp: 16 g, Rfl: 6   hydrOXYzine  (ATARAX/VISTARIL) 25 MG tablet, Take 1 tablet (25 mg total) by mouth at bedtime as needed. (NEEDS TO BE SEEN BEFORE NEXT REFILL), Disp: 30 tablet, Rfl: 0   levothyroxine (SYNTHROID) 112 MCG tablet, TAKE 1 TABLET BY MOUTH EVERY DAY, Disp: 90 tablet, Rfl: 1   modafinil (PROVIGIL) 200 MG tablet, Take 0.5-1 tablets (100-200 mg total) by mouth daily. TAKE 1 OR 2 TABLETS BY MOUTH EVERY MORNING, Disp: 30 tablet, Rfl: 5   PARoxetine (PAXIL) 30 MG tablet, Take two tablets by mouth every morning., Disp: 180 tablet, Rfl: 3   Vitamin D, Ergocalciferol, (DRISDOL) 1.25 MG (50000 UT) CAPS capsule, TAKE 1 CAPSULE (50,000 UNITS TOTAL) BY MOUTH EVERY 7 (SEVEN) DAYS., Disp: 8 capsule, Rfl: 0  Observations/Objective: Patient is well-developed, well-nourished in no acute distress.  Resting comfortably at home.  Head is normocephalic, atraumatic.  No labored breathing. Speech is clear and coherent with logical content.  Patient is alert and oriented at baseline.   Assessment and Plan: 1. COVID-19 COVID +. Moderate symptoms. She does have some risk factors for complicated illness. Discussed risks/benefits of antiviral medications including most common potential ADRs. Patient voiced understanding and would like to proceed with antiviral medication. They are candidate for molnupiravir. Rx sent to pharmacy. Supportive measures, OTC medications and vitamin regimen reviewed. Patient has been enrolled in a MyChart COVID symptom monitoring program. Anne Shutter reviewed in detail. Strict ER precautions discussed with patient.    Follow Up Instructions: I discussed the assessment and treatment plan with the patient. The patient was provided an opportunity to ask questions and all were answered. The patient agreed with the plan and demonstrated an understanding of the instructions.  A copy of instructions were sent to the patient via MyChart.  The patient was advised to call back or seek an in-person evaluation if the symptoms  worsen or if the condition fails to improve as anticipated.  Time:  I spent 15 minutes with the patient via telehealth technology discussing the above problems/concerns.    Piedad Climes, PA-C

## 2021-05-15 NOTE — Patient Instructions (Signed)
  Krystal Mckee, thank you for joining Piedad Climes, PA-C for today's virtual visit.  While this provider is not your primary care provider (PCP), if your PCP is located in our provider database this encounter information will be shared with them immediately following your visit.  Consent: (Patient) Krystal Mckee provided verbal consent for this virtual visit at the beginning of the encounter.  Current Medications:  Current Outpatient Medications:    fluticasone (FLONASE) 50 MCG/ACT nasal spray, Place 2 sprays into both nostrils daily., Disp: 16 g, Rfl: 6   hydrOXYzine (ATARAX/VISTARIL) 25 MG tablet, Take 1 tablet (25 mg total) by mouth at bedtime as needed. (NEEDS TO BE SEEN BEFORE NEXT REFILL), Disp: 30 tablet, Rfl: 0   levothyroxine (SYNTHROID) 112 MCG tablet, TAKE 1 TABLET BY MOUTH EVERY DAY, Disp: 90 tablet, Rfl: 1   modafinil (PROVIGIL) 200 MG tablet, Take 0.5-1 tablets (100-200 mg total) by mouth daily. TAKE 1 OR 2 TABLETS BY MOUTH EVERY MORNING, Disp: 30 tablet, Rfl: 5   PARoxetine (PAXIL) 30 MG tablet, Take two tablets by mouth every morning., Disp: 180 tablet, Rfl: 3   Vitamin D, Ergocalciferol, (DRISDOL) 1.25 MG (50000 UT) CAPS capsule, TAKE 1 CAPSULE (50,000 UNITS TOTAL) BY MOUTH EVERY 7 (SEVEN) DAYS., Disp: 8 capsule, Rfl: 0   Medications ordered in this encounter:  No orders of the defined types were placed in this encounter.    *If you need refills on other medications prior to your next appointment, please contact your pharmacy*  Follow-Up: Call back or seek an in-person evaluation if the symptoms worsen or if the condition fails to improve as anticipated.  Other Instructions Please keep well-hydrated and get plenty of rest. Start a saline nasal rinse to flush out your nasal passages. You can use plain Mucinex to help thin congestion. If you have a humidifier, running in the bedroom at night. I want you to start OTC vitamin D3 1000 units daily,  vitamin C 1000 mg daily, and a zinc supplement. Please take prescribed medications as directed.  You have been enrolled in a MyChart symptom monitoring program. Please answer these questions daily so we can keep track of how you are doing.  You were to quarantine for 5 days from onset of your symptoms.  After day 5, if you have had no fever and you are feeling better, you can end quarantine but need to mask for an additional 5 days. After day 5 if you have a fever or are having significant symptoms, please quarantine for full 10 days.  If you note any worsening of symptoms, any significant shortness of breath or any chest pain, please seek ER evaluation ASAP.  Please do not delay care!    If you have been instructed to have an in-person evaluation today at a local Urgent Care facility, please use the link below. It will take you to a list of all of our available [facility], including address, phone number and hours of operation. Please do not delay care.  [facility]  If you or a family member do not have a primary care provider, use the link below to schedule a visit and establish care. When you choose a [facility] primary care physician or advanced practice provider, you gain a long-term partner in health. Find a Primary Care Provider  Learn more about [facility]'s in-office and virtual care options: [facility] - Get Care Now

## 2021-05-29 ENCOUNTER — Encounter: Payer: Self-pay | Admitting: Family Medicine

## 2021-05-29 ENCOUNTER — Ambulatory Visit: Payer: BC Managed Care – PPO | Admitting: Family Medicine

## 2021-05-29 ENCOUNTER — Other Ambulatory Visit: Payer: Self-pay

## 2021-05-29 VITALS — BP 118/84 | HR 81 | Temp 97.2°F | Ht 64.0 in | Wt 207.8 lb

## 2021-05-29 DIAGNOSIS — G47419 Narcolepsy without cataplexy: Secondary | ICD-10-CM | POA: Diagnosis not present

## 2021-05-29 DIAGNOSIS — Z79899 Other long term (current) drug therapy: Secondary | ICD-10-CM

## 2021-05-29 DIAGNOSIS — E039 Hypothyroidism, unspecified: Secondary | ICD-10-CM | POA: Diagnosis not present

## 2021-05-29 DIAGNOSIS — F411 Generalized anxiety disorder: Secondary | ICD-10-CM | POA: Diagnosis not present

## 2021-05-29 DIAGNOSIS — E559 Vitamin D deficiency, unspecified: Secondary | ICD-10-CM

## 2021-05-29 MED ORDER — HYDROXYZINE HCL 25 MG PO TABS: 25.0000 mg | ORAL_TABLET | Freq: Every evening | ORAL | 1 refills | Status: DC | PRN

## 2021-05-29 MED ORDER — MODAFINIL 200 MG PO TABS: 100.0000 mg | ORAL_TABLET | Freq: Every day | ORAL | 1 refills | Status: DC

## 2021-05-29 MED ORDER — LEVOTHYROXINE SODIUM 112 MCG PO TABS: 112.0000 ug | ORAL_TABLET | Freq: Every day | ORAL | 1 refills | Status: DC

## 2021-05-29 MED ORDER — PAROXETINE HCL 30 MG PO TABS: ORAL_TABLET | ORAL | 3 refills | Status: DC

## 2021-05-29 NOTE — Progress Notes (Signed)
 Subjective: CC: Follow-up narcolepsy, anxiety, hypothyroidism PCP: Gottschalk, Ashly M, DO HPI:Krystal Mckee is a 45 y.o. female presenting to clinic today for:  1.  Narcolepsy Patient was under the care of of Dr. Bradley Vaughn in [place] but he apparently has now moved to [place].  She notes that she continues to use the Provigil on a daily basis if needed.  During the summer months, she is able to take naps and therefore she does not take this.  She continues to use Atarax as needed to help with nighttime sleeping.  2.  Hypothyroidism Compliant with Synthroid.  No reports of change in voice, difficulty swallowing, change in bowel habits or tremor.  3.  Anxiety disorder Patient is compliant with Paxil 60 mg daily.  Previously tried Zoloft which she had allergic reaction.  She is been on BuSpar this was discontinued for an unknown reason.  Head adverse reaction to Klonopin.  Has also been on gabapentin in the past with adverse reaction.  She is having some breakthrough anxiety symptoms, which is a change for her on this medicine as it has been controlling her mood pretty well.  Would be interested in genetic testing.   ROS: Per HPI  Allergies  Allergen Reactions   Penicillins Anaphylaxis and Swelling    Has patient had a PCN reaction causing immediate rash, facial/tongue/throat swelling, SOB or lightheadedness with hypotension: Yes Has patient had a PCN reaction causing severe rash involving mucus membranes or skin necrosis: Yes Has patient had a PCN reaction that required hospitalization No Has patient had a PCN reaction occurring within the last 10 years: No If all of the above answers are "NO", then may proceed with Cephalosporin use.    Zoloft [Sertraline Hcl] Itching and Rash   Past Medical History:  Diagnosis Date   Anxiety    Colon polyps    Depression    Hypothyroidism    Sleep apnea     Current Outpatient Medications:    fluticasone (FLONASE) 50 MCG/ACT nasal  spray, Place 2 sprays into both nostrils daily., Disp: 16 g, Rfl: 6   Vitamin D, Ergocalciferol, (DRISDOL) 1.25 MG (50000 UT) CAPS capsule, TAKE 1 CAPSULE (50,000 UNITS TOTAL) BY MOUTH EVERY 7 (SEVEN) DAYS., Disp: 8 capsule, Rfl: 0   hydrOXYzine (ATARAX/VISTARIL) 25 MG tablet, Take 1 tablet (25 mg total) by mouth at bedtime as needed., Disp: 90 tablet, Rfl: 1   levothyroxine (SYNTHROID) 112 MCG tablet, Take 1 tablet (112 mcg total) by mouth daily., Disp: 90 tablet, Rfl: 1   modafinil (PROVIGIL) 200 MG tablet, Take 0.5-1 tablets (100-200 mg total) by mouth daily., Disp: 90 tablet, Rfl: 1   PARoxetine (PAXIL) 30 MG tablet, Take two tablets by mouth every morning., Disp: 180 tablet, Rfl: 3 Social History   Socioeconomic History   Marital status: Single    Spouse name: Not on file   Number of children: 3   Years of education: college   Highest education level: Not on file  Occupational History    Employer: [employer]  Tobacco Use   Smoking status: Some Days    Packs/day: 0.25    Years: 5.00    Pack years: 1.25    Types: Cigarettes   Smokeless tobacco: Never  Vaping Use   Vaping Use: Never used  Substance and Sexual Activity   Alcohol use: No   Drug use: No   Sexual activity: Yes    Birth control/protection: Other-see comments    Comment: [facility] 2001  Other   Topics Concern   Not on file  Social History Narrative   Not on file   Social Determinants of Health   Financial Resource Strain: Not on file  Food Insecurity: Not on file  Transportation Needs: Not on file  Physical Activity: Not on file  Stress: Not on file  Social Connections: Not on file  Intimate Partner Violence: Not on file   Family History  Problem Relation Age of Onset   Colon polyps Father    Hepatitis C Father    Ovarian cancer Mother    Diabetes Maternal Grandmother    Colon cancer Neg Hx    Pancreatic cancer Neg Hx    Stomach cancer Neg Hx    Esophageal cancer Neg Hx     Objective: Office vital signs  reviewed. BP 118/84   Pulse 81   Temp (!) 97.2 F (36.2 C)   Ht 5' 4" (1.626 m)   Wt 207 lb 12.8 oz (94.3 kg)   SpO2 98%   BMI 35.67 kg/m   Physical Examination:  General: Awake, alert, obese, No acute distress HEENT: Normal; no exophthalmos.  No goiter Cardio: regular rate and rhythm, S1S2 heard, no murmurs appreciated Pulm: clear to auscultation bilaterally, no wheezes, rhonchi or rales; normal work of breathing on room air Skin: dry; intact; no rashes or lesions; normal temperature Neuro: No tremor Psych: Mood is stable.  Patient is pleasant and interactive. Depression screen [facility] 2/9 05/29/2021 06/07/2020 09/01/2019  Decreased Interest 2 0 0  Down, Depressed, Hopeless 2 0 0  PHQ - 2 Score 4 0 0  Altered sleeping 3 0 2  Tired, decreased energy 2 0 1  Change in appetite 1 0 0  Feeling bad or failure about yourself  2 0 0  Trouble concentrating 2 0 1  Moving slowly or fidgety/restless 1 0 0  Suicidal thoughts 2 0 0  PHQ-9 Score 17 0 4  Difficult doing work/chores Somewhat difficult - -  Some recent data might be hidden   GAD 7 : Generalized Anxiety Score 05/29/2021 06/07/2020 09/01/2019 01/12/2019  Nervous, Anxious, on Edge 2 0 0 0  Control/stop worrying 2 0 0 1  Worry too much - different things 2 0 0 1  Trouble relaxing 2 0 0 0  Restless 1 0 0 0  Easily annoyed or irritable 2 0 0 1  Afraid - awful might happen 2 0 0 0  Total GAD 7 Score 13 0 0 3  Anxiety Difficulty Somewhat difficult - - Not difficult at all    Assessment/ Plan: 45 y.o. female   Primary narcolepsy without cataplexy - Plan: modafinil (PROVIGIL) 200 MG tablet, Drug Screen 10 W/Conf, Se  Controlled substance agreement signed - Plan: Drug Screen 10 W/Conf, Se  GAD (generalized anxiety disorder) - Plan: PARoxetine (PAXIL) 30 MG tablet, Drug Screen 10 W/Conf, Se  Acquired hypothyroidism - Plan: TSH, T4, Free, CMP14+EGFR, Lipid Panel  Vitamin D deficiency - Plan: CMP14+EGFR, VITAMIN D 25 Hydroxy (Vit-D  Deficiency, Fractures)  National narcotic database was reviewed and there were no red flags.  Drug screen and controlled substance contract were completed as per office policy today.  I continued her on the Paxil 60 mg for now but have given her information on [facility] testing.  Asymptomatic from a thyroid standpoint except for slight increase in anxiety.  Check TSH, free T4  Vitamin D levels with above labs.  Currently on OTC vitamin D  Orders Placed This Encounter  Procedures  Drug Screen 10 W/Conf, Se   TSH   T4, Free   CMP14+EGFR   Lipid Panel   VITAMIN D 25 Hydroxy (Vit-D Deficiency, Fractures)   Meds ordered this encounter  Medications   hydrOXYzine (ATARAX/VISTARIL) 25 MG tablet    Sig: Take 1 tablet (25 mg total) by mouth at bedtime as needed.    Dispense:  90 tablet    Refill:  1   levothyroxine (SYNTHROID) 112 MCG tablet    Sig: Take 1 tablet (112 mcg total) by mouth daily.    Dispense:  90 tablet    Refill:  1   PARoxetine (PAXIL) 30 MG tablet    Sig: Take two tablets by mouth every morning.    Dispense:  180 tablet    Refill:  3   modafinil (PROVIGIL) 200 MG tablet    Sig: Take 0.5-1 tablets (100-200 mg total) by mouth daily.    Dispense:  90 tablet    Refill:  1     Ashly M Gottschalk, DO [facility] [phone]   

## 2021-05-29 NOTE — Patient Instructions (Signed)

## 2021-05-31 ENCOUNTER — Telehealth: Payer: Self-pay

## 2021-05-31 NOTE — Telephone Encounter (Signed)
[facility] Key: [id number] - PA Case ID: [id number] - Rx #: [id number] Need help? Call us at [phone] Status Sent to [facility] Modafinil 200MG  tablets

## 2021-06-01 NOTE — Telephone Encounter (Signed)
lmtcb

## 2021-06-01 NOTE — Telephone Encounter (Signed)
Fax from [facility] Alternative requested: Prior Auth Denied (per note from pharmacy) Please appeal to insurance or send alternative

## 2021-06-01 NOTE — Telephone Encounter (Signed)
Please inform patient.  She may need to establish with new sleep doctor since her's moved to [facility]

## 2021-06-04 ENCOUNTER — Telehealth: Payer: Self-pay | Admitting: Family Medicine

## 2021-06-04 DIAGNOSIS — K760 Fatty (change of) liver, not elsewhere classified: Secondary | ICD-10-CM

## 2021-06-04 DIAGNOSIS — R748 Abnormal levels of other serum enzymes: Secondary | ICD-10-CM

## 2021-06-04 LAB — CMP14+EGFR
ALT: 35 IU/L — ABNORMAL HIGH (ref 0–32)
AST: 26 IU/L (ref 0–40)
Albumin/Globulin Ratio: 1.8 (ref 1.2–2.2)
Albumin: 4.5 g/dL (ref 3.8–4.8)
Alkaline Phosphatase: 63 IU/L (ref 44–121)
BUN/Creatinine Ratio: 11 (ref 9–23)
BUN: 10 mg/dL (ref 6–24)
Bilirubin Total: 1.2 mg/dL (ref 0.0–1.2)
CO2: 21 mmol/L (ref 20–29)
Calcium: 9.5 mg/dL (ref 8.7–10.2)
Chloride: 101 mmol/L (ref 96–106)
Creatinine, Ser: 0.87 mg/dL (ref 0.57–1.00)
Globulin, Total: 2.5 g/dL (ref 1.5–4.5)
Glucose: 99 mg/dL (ref 65–99)
Potassium: 4.1 mmol/L (ref 3.5–5.2)
Sodium: 139 mmol/L (ref 134–144)
Total Protein: 7 g/dL (ref 6.0–8.5)
eGFR: 84 mL/min/{1.73_m2} (ref >59)

## 2021-06-04 LAB — DRUG SCREEN 10 W/CONF, SERUM
Amphetamines, IA: NEGATIVE ng/mL
Barbiturates, IA: NEGATIVE ug/mL
Benzodiazepines, IA: NEGATIVE ng/mL
Cocaine & Metabolite, IA: NEGATIVE ng/mL
Methadone, IA: NEGATIVE ng/mL
Opiates, IA: NEGATIVE ng/mL
Oxycodones, IA: NEGATIVE ng/mL
Phencyclidine, IA: NEGATIVE ng/mL
Propoxyphene, IA: NEGATIVE ng/mL
THC(Marijuana) Metabolite, IA: NEGATIVE ng/mL

## 2021-06-04 LAB — LIPID PANEL
Chol/HDL Ratio: 5 ratio — ABNORMAL HIGH (ref 0.0–4.4)
Cholesterol, Total: 227 mg/dL — ABNORMAL HIGH (ref 100–199)
HDL: 45 mg/dL (ref >39)
LDL Chol Calc (NIH): 135 mg/dL — ABNORMAL HIGH (ref 0–99)
Triglycerides: 264 mg/dL — ABNORMAL HIGH (ref 0–149)
VLDL Cholesterol Cal: 47 mg/dL — ABNORMAL HIGH (ref 5–40)

## 2021-06-04 LAB — VITAMIN D 25 HYDROXY (VIT D DEFICIENCY, FRACTURES): Vit D, 25-Hydroxy: 24 ng/mL — ABNORMAL LOW (ref 30.0–100.0)

## 2021-06-04 LAB — T4, FREE: Free T4: 1.14 ng/dL (ref 0.82–1.77)

## 2021-06-04 LAB — TSH: TSH: 5.48 u[IU]/mL — ABNORMAL HIGH (ref 0.450–4.500)

## 2021-06-04 NOTE — Telephone Encounter (Signed)
Pt informed of all results and Dr. Delynn Flavin recommendations. She is ok for nutritionist referral.

## 2021-06-22 ENCOUNTER — Telehealth: Payer: Self-pay

## 2021-06-22 NOTE — Telephone Encounter (Signed)
Key: [id number] - PA Case ID: [id number] -  Rx #: [id number] Need help? Call us at [phone]  Status    Sent to Plan today  Drug   Modafinil 200MG  tablets  Form [employer] Electronic PA Form [id number] NCPDP) Original Claim Info 75 PRIOR AUTH REQ-MD CALL [id number].DRUG REQUIRES PRIOR AUTHORIZATION

## 2021-06-26 NOTE — Telephone Encounter (Signed)
She will likely need to reestablish with a sleep doctor for management of this.  Please inform pt

## 2021-06-26 NOTE — Telephone Encounter (Signed)
Deniedon September 2 Your PA request has been denied. Additional information will be provided in the denial communication. (Message 1140) Your PA request has been denied. Additional information will be provided in the denial communication. (Message 1140) 

## 2021-07-10 ENCOUNTER — Encounter: Payer: Self-pay | Admitting: Family Medicine

## 2021-07-11 ENCOUNTER — Ambulatory Visit: Payer: BC Managed Care – PPO | Admitting: Nutrition

## 2021-07-11 ENCOUNTER — Other Ambulatory Visit: Payer: Self-pay | Admitting: Family Medicine

## 2021-07-11 DIAGNOSIS — G47419 Narcolepsy without cataplexy: Secondary | ICD-10-CM

## 2021-07-11 DIAGNOSIS — G479 Sleep disorder, unspecified: Secondary | ICD-10-CM

## 2021-07-16 ENCOUNTER — Telehealth: Payer: Self-pay | Admitting: *Deleted

## 2021-07-16 DIAGNOSIS — G47419 Narcolepsy without cataplexy: Secondary | ICD-10-CM

## 2021-07-16 DIAGNOSIS — G479 Sleep disorder, unspecified: Secondary | ICD-10-CM

## 2021-07-16 NOTE — Telephone Encounter (Signed)
(  Key: [id number]) Modafinil 200MG  tablets Sent to plan

## 2021-07-23 ENCOUNTER — Encounter: Payer: Self-pay | Admitting: *Deleted

## 2021-08-17 NOTE — Telephone Encounter (Signed)
[facility]  received a request from your provider for coverage of Modafinil Tablets. The request was denied because: Current plan approved criteria does not allow coverage of this drug if it is not being prescribed by, or in consultation with, a sleep specialist. Your use of this drug does not meet the requirement. This is based on the information that was provided. You can ask for a free copy of the actual benefit provision, guideline, protocol or other similar criterion used to make the decision and any other information related to this decision by calling Customer Care at [phone]. For more information regarding your prescription benefit, please refer to your Benefit Booklet available on the Plan's website at [email or web address] You may request an appeal by sending a written request to the address below. To be eligible for an appeal, your request must be in writing and received within 180 days of the date of this letter. Please mail or fax your appeal to: [facility] Department / [address] [address] [place], [place] [address] Fax: [phone]

## 2021-08-20 ENCOUNTER — Ambulatory Visit: Payer: BC Managed Care – PPO | Admitting: Nutrition

## 2021-08-20 ENCOUNTER — Telehealth: Payer: Self-pay | Admitting: Nutrition

## 2021-08-20 NOTE — Telephone Encounter (Signed)
No answer

## 2021-11-03 ENCOUNTER — Telehealth: Payer: BC Managed Care – PPO | Admitting: Nurse Practitioner

## 2021-11-03 DIAGNOSIS — K047 Periapical abscess without sinus: Secondary | ICD-10-CM | POA: Diagnosis not present

## 2021-11-03 MED ORDER — CLINDAMYCIN HCL 300 MG PO CAPS: 300.0000 mg | ORAL_CAPSULE | Freq: Three times a day (TID) | ORAL | 0 refills | Status: AC

## 2021-11-03 MED ORDER — CHLORHEXIDINE GLUCONATE 0.12 % MT SOLN: 15.0000 mL | Freq: Two times a day (BID) | OROMUCOSAL | 0 refills | Status: DC

## 2021-11-03 NOTE — Progress Notes (Signed)
Virtual Visit Consent   Krystal Mckee, you are scheduled for a virtual visit with a [facility] provider today.     Just as with appointments in the office, your consent must be obtained to participate.  Your consent will be active for this visit and any virtual visit you may have with one of our providers in the next 365 days.     If you have a MyChart account, a copy of this consent can be sent to you electronically.  All virtual visits are billed to your insurance company just like a traditional visit in the office.    As this is a virtual visit, video technology does not allow for your provider to perform a traditional examination.  This may limit your provider's ability to fully assess your condition.  If your provider identifies any concerns that need to be evaluated in person or the need to arrange testing (such as labs, EKG, etc.), we will make arrangements to do so.     Although advances in technology are sophisticated, we cannot ensure that it will always work on either your end or our end.  If the connection with a video visit is poor, the visit may have to be switched to a telephone visit.  With either a video or telephone visit, we are not always able to ensure that we have a secure connection.     I need to obtain your verbal consent now.   Are you willing to proceed with your visit today?    KAILY WRAGG has provided verbal consent on 11/03/2021 for a virtual visit (video or telephone).   Claiborne Rigg, NP   Date: 11/03/2021 9:58 AM   Virtual Visit via Video Note   I, Claiborne Rigg, connected with  Krystal Mckee  ([id number], 10-21-1976) on 11/03/21 at 10:00 AM EST by a video-enabled telemedicine application and verified that I am speaking with the correct person using two identifiers.  Location: Patient: Virtual Visit Location Patient: Home Provider: Virtual Visit Location Provider: Home Office   I discussed the limitations of evaluation and  management by telemedicine and the availability of in person appointments. The patient expressed understanding and agreed to proceed.    History of Present Illness: Krystal Mckee is a 46 y.o. who identifies as a female who was assigned female at birth, and is being seen today for dental pain.  Notes small fluid filled sac above left upper molar. There is associated pain. Denies fever, sore throat, foul odor in mouth or purulent drainage. She does not currently smoke but has a remote smoking history per review of chart.   Problems:  Patient Active Problem List   Diagnosis Date Noted   History of colonic polyps 09/23/2018   Chronic constipation 09/23/2018   Obesity (BMI 30-39.9) 03/15/2016   Hyperlipidemia 08/31/2015   Vitamin D deficiency 02/07/2015   Hypothyroidism 05/31/2014   Depression 05/31/2014   GAD (generalized anxiety disorder) 05/31/2014   Syncope 02/18/2013   Dyspareunia 01/12/2013   IBS (irritable colon syndrome) 01/12/2013    Allergies:  Allergies  Allergen Reactions   Penicillins Anaphylaxis and Swelling    Has patient had a PCN reaction causing immediate rash, facial/tongue/throat swelling, SOB or lightheadedness with hypotension: Yes Has patient had a PCN reaction causing severe rash involving mucus membranes or skin necrosis: Yes Has patient had a PCN reaction that required hospitalization No Has patient had a PCN reaction occurring within the last 10 years: No If all  of the above answers are "NO", then may proceed with Cephalosporin use.    Zoloft [Sertraline Hcl] Itching and Rash   Medications:  Current Outpatient Medications:    chlorhexidine (PERIDEX) 0.12 % solution, Use as directed 15 mLs in the mouth or throat 2 (two) times daily., Disp: 473 mL, Rfl: 0   clindamycin (CLEOCIN) 300 MG capsule, Take 1 capsule (300 mg total) by mouth 3 (three) times daily for 7 days., Disp: 21 capsule, Rfl: 0   fluticasone (FLONASE) 50 MCG/ACT nasal spray, Place 2  sprays into both nostrils daily., Disp: 16 g, Rfl: 6   hydrOXYzine (ATARAX/VISTARIL) 25 MG tablet, Take 1 tablet (25 mg total) by mouth at bedtime as needed., Disp: 90 tablet, Rfl: 1   levothyroxine (SYNTHROID) 112 MCG tablet, Take 1 tablet (112 mcg total) by mouth daily., Disp: 90 tablet, Rfl: 1   modafinil (PROVIGIL) 200 MG tablet, Take 0.5-1 tablets (100-200 mg total) by mouth daily., Disp: 90 tablet, Rfl: 1   PARoxetine (PAXIL) 30 MG tablet, Take two tablets by mouth every morning., Disp: 180 tablet, Rfl: 3   Vitamin D, Ergocalciferol, (DRISDOL) 1.25 MG (50000 UT) CAPS capsule, TAKE 1 CAPSULE (50,000 UNITS TOTAL) BY MOUTH EVERY 7 (SEVEN) DAYS., Disp: 8 capsule, Rfl: 0  Observations/Objective: Patient is well-developed, well-nourished in no acute distress.  Resting comfortably  at home.  Head is normocephalic, atraumatic.  No labored breathing.  Speech is clear and coherent with logical content.  Patient is alert and oriented at baseline.    Assessment and Plan: 1. Dental infection - clindamycin (CLEOCIN) 300 MG capsule; Take 1 capsule (300 mg total) by mouth 3 (three) times daily for 7 days.  Dispense: 21 capsule; Refill: 0 - chlorhexidine (PERIDEX) 0.12 % solution; Use as directed 15 mLs in the mouth or throat 2 (two) times daily.  Dispense: 473 mL; Refill: 0  It is imperative that you see a dentist within 10 days of this eVisit to determine the cause of the dental pain and be sure it is adequately treated  Follow Up Instructions: I discussed the assessment and treatment plan with the patient. The patient was provided an opportunity to ask questions and all were answered. The patient agreed with the plan and demonstrated an understanding of the instructions.  A copy of instructions were sent to the patient via MyChart unless otherwise noted below.     The patient was advised to call back or seek an in-person evaluation if the symptoms worsen or if the condition fails to improve as  anticipated.  Time:  I spent 14 minutes with the patient via telehealth technology discussing the above problems/concerns.    Claiborne Rigg, NP

## 2021-11-03 NOTE — Patient Instructions (Signed)
°  Krystal Mckee, thank you for joining Gildardo Pounds, NP for today's virtual visit.  While this provider is not your primary care provider (PCP), if your PCP is located in our provider database this encounter information will be shared with them immediately following your visit.  Consent: (Patient) Krystal Mckee provided verbal consent for this virtual visit at the beginning of the encounter.  Current Medications:  Current Outpatient Medications:    chlorhexidine (PERIDEX) 0.12 % solution, Use as directed 15 mLs in the mouth or throat 2 (two) times daily., Disp: 473 mL, Rfl: 0   clindamycin (CLEOCIN) 300 MG capsule, Take 1 capsule (300 mg total) by mouth 3 (three) times daily for 7 days., Disp: 21 capsule, Rfl: 0   fluticasone (FLONASE) 50 MCG/ACT nasal spray, Place 2 sprays into both nostrils daily., Disp: 16 g, Rfl: 6   hydrOXYzine (ATARAX/VISTARIL) 25 MG tablet, Take 1 tablet (25 mg total) by mouth at bedtime as needed., Disp: 90 tablet, Rfl: 1   levothyroxine (SYNTHROID) 112 MCG tablet, Take 1 tablet (112 mcg total) by mouth daily., Disp: 90 tablet, Rfl: 1   modafinil (PROVIGIL) 200 MG tablet, Take 0.5-1 tablets (100-200 mg total) by mouth daily., Disp: 90 tablet, Rfl: 1   PARoxetine (PAXIL) 30 MG tablet, Take two tablets by mouth every morning., Disp: 180 tablet, Rfl: 3   Vitamin D, Ergocalciferol, (DRISDOL) 1.25 MG (50000 UT) CAPS capsule, TAKE 1 CAPSULE (50,000 UNITS TOTAL) BY MOUTH EVERY 7 (SEVEN) DAYS., Disp: 8 capsule, Rfl: 0   Medications ordered in this encounter:  Meds ordered this encounter  Medications   clindamycin (CLEOCIN) 300 MG capsule    Sig: Take 1 capsule (300 mg total) by mouth 3 (three) times daily for 7 days.    Dispense:  21 capsule    Refill:  0    Order Specific Question:   Supervising Provider    Answer:   MILLER, BRIAN [[id number]]   chlorhexidine (PERIDEX) 0.12 % solution    Sig: Use as directed 15 mLs in the mouth or throat 2 (two) times daily.     Dispense:  473 mL    Refill:  0    Order Specific Question:   Supervising Provider    Answer:   Sabra Heck, BRIAN [[id number]]     *If you need refills on other medications prior to your next appointment, please contact your pharmacy*  Follow-Up: Call back or seek an in-person evaluation if the symptoms worsen or if the condition fails to improve as anticipated.  Other Instructions It is imperative that you see a dentist within 10 days of this eVisit to determine the cause of the dental pain and be sure it is adequately treated   If you have been instructed to have an in-person evaluation today at a local Urgent Care facility, please use the link below. It will take you to a list of all of our available [facility], including address, phone number and hours of operation. Please do not delay care.  [facility]  If you or a family member do not have a primary care provider, use the link below to schedule a visit and establish care. When you choose a [facility] primary care physician or advanced practice provider, you gain a long-term partner in health. Find a Primary Care Provider  Learn more about [facility]'s in-office and virtual care options: [facility]

## 2021-11-22 ENCOUNTER — Encounter: Payer: Self-pay | Admitting: Family Medicine

## 2021-11-22 ENCOUNTER — Ambulatory Visit (INDEPENDENT_AMBULATORY_CARE_PROVIDER_SITE_OTHER): Payer: BC Managed Care – PPO | Admitting: Family Medicine

## 2021-11-22 VITALS — BP 117/82 | HR 81 | Temp 97.5°F | Ht 64.0 in | Wt 203.8 lb

## 2021-11-22 DIAGNOSIS — L819 Disorder of pigmentation, unspecified: Secondary | ICD-10-CM

## 2021-11-22 DIAGNOSIS — Z0001 Encounter for general adult medical examination with abnormal findings: Secondary | ICD-10-CM | POA: Diagnosis not present

## 2021-11-22 DIAGNOSIS — K760 Fatty (change of) liver, not elsewhere classified: Secondary | ICD-10-CM | POA: Diagnosis not present

## 2021-11-22 DIAGNOSIS — E039 Hypothyroidism, unspecified: Secondary | ICD-10-CM | POA: Diagnosis not present

## 2021-11-22 DIAGNOSIS — R748 Abnormal levels of other serum enzymes: Secondary | ICD-10-CM

## 2021-11-22 DIAGNOSIS — G47419 Narcolepsy without cataplexy: Secondary | ICD-10-CM

## 2021-11-22 DIAGNOSIS — E559 Vitamin D deficiency, unspecified: Secondary | ICD-10-CM

## 2021-11-22 DIAGNOSIS — Z0000 Encounter for general adult medical examination without abnormal findings: Secondary | ICD-10-CM

## 2021-11-22 DIAGNOSIS — Z808 Family history of malignant neoplasm of other organs or systems: Secondary | ICD-10-CM

## 2021-11-22 MED ORDER — HYDROXYZINE HCL 25 MG PO TABS
25.0000 mg | ORAL_TABLET | Freq: Every evening | ORAL | 1 refills | Status: DC | PRN
Start: 2021-11-22 — End: 2022-11-06

## 2021-11-22 MED ORDER — VITAMIN D (ERGOCALCIFEROL) 1.25 MG (50000 UNIT) PO CAPS: 50000.0000 [IU] | ORAL_CAPSULE | ORAL | 3 refills | Status: DC

## 2021-11-22 MED ORDER — LEVOTHYROXINE SODIUM 112 MCG PO TABS: 112.0000 ug | ORAL_TABLET | Freq: Every day | ORAL | 1 refills | Status: DC

## 2021-11-22 NOTE — Patient Instructions (Signed)
You had labs performed today.  You will be contacted with the results of the labs once they are available, usually in the next 3 business days for routine lab work.  If you have an active my chart account, they will be released to your MyChart.  If you prefer to have these labs released to you via telephone, please let us know. ° ° Preventive Care 40-47 Years Old, Female °Preventive care refers to lifestyle choices and visits with your health care provider that can promote health and wellness. Preventive care visits are also called wellness exams. °What can I expect for my preventive care visit? °Counseling °Your health care provider may ask you questions about your: °Medical history, including: °Past medical problems. °Family medical history. °Pregnancy history. °Current health, including: °Menstrual cycle. °Method of birth control. °Emotional well-being. °Home life and relationship well-being. °Sexual activity and sexual health. °Lifestyle, including: °Alcohol, nicotine or tobacco, and drug use. °Access to firearms. °Diet, exercise, and sleep habits. °Work and work environment. °Sunscreen use. °Safety issues such as seatbelt and bike helmet use. °Physical exam °Your health care provider will check your: °Height and weight. These may be used to calculate your BMI (body mass index). BMI is a measurement that tells if you are at a healthy weight. °Waist circumference. This measures the distance around your waistline. This measurement also tells if you are at a healthy weight and may help predict your risk of certain diseases, such as type 2 diabetes and high blood pressure. °Heart rate and blood pressure. °Body temperature. °Skin for abnormal spots. °What immunizations do I need? °Vaccines are usually given at various ages, according to a schedule. Your health care provider will recommend vaccines for you based on your age, medical history, and lifestyle or other factors, such as travel or where you work. °What tests  do I need? °Screening °Your health care provider may recommend screening tests for certain conditions. This may include: °Lipid and cholesterol levels. °Diabetes screening. This is done by checking your blood sugar (glucose) after you have not eaten for a while (fasting). °Pelvic exam and Pap test. °Hepatitis B test. °Hepatitis C test. °HIV (human immunodeficiency virus) test. °STI (sexually transmitted infection) testing, if you are at risk. °Lung cancer screening. °Colorectal cancer screening. °Mammogram. Talk with your health care provider about when you should start having regular mammograms. This may depend on whether you have a family history of breast cancer. °BRCA-related cancer screening. This may be done if you have a family history of breast, ovarian, tubal, or peritoneal cancers. °Bone density scan. This is done to screen for osteoporosis. °Talk with your health care provider about your test results, treatment options, and if necessary, the need for more tests. °Follow these instructions at home: °Eating and drinking ° °Eat a diet that includes fresh fruits and vegetables, whole grains, lean protein, and low-fat dairy products. °Take vitamin and mineral supplements as recommended by your health care provider. °Do not drink alcohol if: °Your health care provider tells you not to drink. °You are pregnant, may be pregnant, or are planning to become pregnant. °If you drink alcohol: °Limit how much you have to 0-1 drink a day. °Know how much alcohol is in your drink. In the [place] one drink equals one 12 oz bottle of beer (355 mL), one 5 oz glass of wine (148 mL), or one 1½ oz glass of hard liquor (44 mL). °Lifestyle °Brush your teeth every morning and night with fluoride toothpaste. Floss one time each day. °  Exercise for at least 30 minutes 5 or more days each week. °Do not use any products that contain nicotine or tobacco. These products include cigarettes, chewing tobacco, and vaping devices, such as  e-cigarettes. If you need help quitting, ask your health care provider. °Do not use drugs. °If you are sexually active, practice safe sex. Use a condom or other form of protection to prevent STIs. °If you do not wish to become pregnant, use a form of birth control. If you plan to become pregnant, see your health care provider for a prepregnancy visit. °Take aspirin only as told by your health care provider. Make sure that you understand how much to take and what form to take. Work with your health care provider to find out whether it is safe and beneficial for you to take aspirin daily. °Find healthy ways to manage stress, such as: °Meditation, yoga, or listening to music. °Journaling. °Talking to a trusted person. °Spending time with friends and family. °Minimize exposure to UV radiation to reduce your risk of skin cancer. °Safety °Always wear your seat belt while driving or riding in a vehicle. °Do not drive: °If you have been drinking alcohol. Do not ride with someone who has been drinking. °When you are tired or distracted. °While texting. °If you have been using any mind-altering substances or drugs. °Wear a helmet and other protective equipment during sports activities. °If you have firearms in your house, make sure you follow all gun safety procedures. °Seek help if you have been physically or sexually abused. °What's next? °Visit your health care provider once a year for an annual wellness visit. °Ask your health care provider how often you should have your eyes and teeth checked. °Stay up to date on all vaccines. °This information is not intended to replace advice given to you by your health care provider. Make sure you discuss any questions you have with your health care provider. °Document Revised: 04/04/2021 Document Reviewed: 04/04/2021 °Elsevier Patient Education © 2022 [employer] Inc[employer] ° ° °

## 2021-11-22 NOTE — Progress Notes (Signed)
Krystal Mckee is a 46 y.o. female presents to office today for annual physical exam examination.    Concerns today include: 1.  None  Occupation: Education officer, museum, 2nd grade, Substance use: Former smoker.  Quit in 2020 Diet: Fair, Exercise: Stays active.  She has been walking regularly Last eye exam: Up-to-date.  Currently wearing readers Last dental exam: Up-to-date Last colonoscopy: Up-to-date Last mammogram: Up-to-date Last pap smear: N/A, history of partial hysterectomy Refills needed today: All Immunizations needed: Immunization History  Administered Date(s) Administered   Influenza,inj,Quad PF,6+ Mos 08/29/2015, 09/25/2016, 07/25/2019   Influenza-Unspecified 06/16/2019   Tdap 04/30/2012     Past Medical History:  Diagnosis Date   Anxiety    Colon polyps    Depression    Hypothyroidism    Sleep apnea    Social History   Socioeconomic History   Marital status: Single    Spouse name: Not on file   Number of children: 3   Years of education: college   Highest education level: Not on file  Occupational History    Employer: [employer]  Tobacco Use   Smoking status: Former    Packs/day: 0.25    Years: 5.00    Pack years: 1.25    Types: Cigarettes    Quit date: 06/21/2021    Years since quitting: 0.4   Smokeless tobacco: Never  Vaping Use   Vaping Use: Never used  Substance and Sexual Activity   Alcohol use: No   Drug use: No   Sexual activity: Yes    Birth control/protection: Other-see comments    Comment: TVH 2001  Other Topics Concern   Not on file  Social History Narrative   Not on file   Social Determinants of Health   Financial Resource Strain: Not on file  Food Insecurity: Not on file  Transportation Needs: Not on file  Physical Activity: Not on file  Stress: Not on file  Social Connections: Not on file  Intimate Partner Violence: Not on file   Past Surgical History:  Procedure Laterality Date   ANTERIOR AND POSTERIOR REPAIR      CESAREAN SECTION     COLONOSCOPY  01/18/2013   CYST EXCISION     Right wrist   CYST EXCISION     Left knee   FOOT FRACTURE SURGERY Right    PARTIAL HYSTERECTOMY     cervix ABSENT   TONSILLECTOMY AND ADENOIDECTOMY     Family History  Problem Relation Age of Onset   Skin cancer Mother    Ovarian cancer Mother    Colon polyps Father    Hepatitis C Father    Skin cancer Father    Diabetes Maternal Grandmother    Colon cancer Neg Hx    Pancreatic cancer Neg Hx    Stomach cancer Neg Hx    Esophageal cancer Neg Hx     Current Outpatient Medications:    chlorhexidine (PERIDEX) 0.12 % solution, Use as directed 15 mLs in the mouth or throat 2 (two) times daily., Disp: 473 mL, Rfl: 0   fluticasone (FLONASE) 50 MCG/ACT nasal spray, Place 2 sprays into both nostrils daily., Disp: 16 g, Rfl: 6   modafinil (PROVIGIL) 200 MG tablet, Take 0.5-1 tablets (100-200 mg total) by mouth daily., Disp: 90 tablet, Rfl: 1   PARoxetine (PAXIL) 30 MG tablet, Take two tablets by mouth every morning., Disp: 180 tablet, Rfl: 3   hydrOXYzine (ATARAX) 25 MG tablet, Take 1 tablet (25 mg total) by mouth at  bedtime as needed., Disp: 90 tablet, Rfl: 1   levothyroxine (SYNTHROID) 112 MCG tablet, Take 1 tablet (112 mcg total) by mouth daily., Disp: 90 tablet, Rfl: 1   Vitamin D, Ergocalciferol, (DRISDOL) 1.25 MG (50000 UNIT) CAPS capsule, Take 1 capsule (50,000 Units total) by mouth every 7 (seven) days., Disp: 12 capsule, Rfl: 3  Allergies  Allergen Reactions   Penicillins Anaphylaxis and Swelling    Has patient had a PCN reaction causing immediate rash, facial/tongue/throat swelling, SOB or lightheadedness with hypotension: Yes Has patient had a PCN reaction causing severe rash involving mucus membranes or skin necrosis: Yes Has patient had a PCN reaction that required hospitalization No Has patient had a PCN reaction occurring within the last 10 years: No If all of the above answers are "NO", then may proceed  with Cephalosporin use.    Zoloft [Sertraline Hcl] Itching and Rash     ROS: Review of Systems Pertinent items noted in HPI and remainder of comprehensive ROS otherwise negative.    Physical exam BP 117/82    Pulse 81    Temp (!) 97.5 F (36.4 C)    Ht _0  (1.626 m)    Wt 203 lb 12.8 oz (92.4 kg)    SpO2 97%    BMI 34.98 kg/m  General appearance: alert, cooperative, appears stated age, and no distress Head: Normocephalic, without obvious abnormality, atraumatic Eyes: negative findings: lids and lashes normal, conjunctivae and sclerae normal, corneas clear, and pupils equal, round, reactive to light and accomodation Ears: normal TM's and external ear canals both ears Nose: Nares normal. Septum midline. Mucosa normal. No drainage or sinus tenderness. Throat: lips, mucosa, and tongue normal; teeth and gums normal Neck: no adenopathy, no carotid bruit, supple, symmetrical, trachea midline, and thyroid not enlarged, symmetric, no tenderness/mass/nodules Back: symmetric, no curvature. ROM normal. No CVA tenderness. Lungs: clear to auscultation bilaterally Heart: regular rate and rhythm, S1, S2 normal, no murmur, click, rub or gallop Abdomen: soft, non-tender; bowel sounds normal; no masses,  no organomegaly Extremities: extremities normal, atraumatic, no cyanosis or edema Pulses: 2+ and symmetric Skin:  Flat, macular lesion that is brown in color noted along the left jaw Lymph nodes: Cervical, supraclavicular, and axillary nodes normal. Neurologic: Grossly normal Psych: Mood stable, speech normal, affect appropriate.  Patient very pleasant and interactive.   Assessment/ Plan: Krystal Mckee here for annual physical exam.   Annual physical exam  Elevated liver enzymes - Plan: CMP14+EGFR, LDL Cholesterol, Direct  Fatty liver - Plan: CMP14+EGFR, LDL Cholesterol, Direct  Acquired hypothyroidism - Plan: TSH, T4, Free, levothyroxine (SYNTHROID) 112 MCG tablet  Vitamin D  deficiency - Plan: Vitamin D, Ergocalciferol, (DRISDOL) 1.25 MG (50000 UNIT) CAPS capsule, CANCELED: VITAMIN D 25 Hydroxy (Vit-D Deficiency, Fractures)  Primary narcolepsy without cataplexy  Pigmented skin lesion of uncertain behavior of head - Plan: Ambulatory referral to Dermatology  Family history of skin cancer - Plan: Ambulatory referral to Dermatology  I have updated her chart to reflect that she in fact does not have a cervix anymore.  No need for Pap smearing.  Last pelvic exam was performed by Dr. Roselie Awkward, OB/GYN, back in 2014 and he confirmed that vaginal cuff only present.  Recheck CMP, direct LDL given elevation in liver enzyme and known fatty liver disease  She is asymptomatic from a thyroid standpoint.  Check TSH, free T4.  Synthroid renewed  I have placed her on high-dose vitamin D because she has not been especially compliant with daily vitamin  D.  Plan to check vitamin D level at next visit  Narcolepsy being managed by her neurologist.  She seems to be doing very well on current regimen  Uncertain nature of this very new pigmented skin lesion along the left side of her face.  I suspect its melasma in nature but given the fact that she has a strong family history of skin cancer, I feel it more appropriate to have a dermatologist further evaluate.  Referral has been placed  May follow-up in 6 months for thyroid check  Krystal Belanger M. Lajuana Ripple, DO

## 2021-11-23 LAB — CMP14+EGFR
ALT: 17 IU/L (ref 0–32)
AST: 18 IU/L (ref 0–40)
Albumin/Globulin Ratio: 1.9 (ref 1.2–2.2)
Albumin: 5 g/dL — ABNORMAL HIGH (ref 3.8–4.8)
Alkaline Phosphatase: 71 IU/L (ref 44–121)
BUN/Creatinine Ratio: 14 (ref 9–23)
BUN: 13 mg/dL (ref 6–24)
Bilirubin Total: 0.9 mg/dL (ref 0.0–1.2)
CO2: 25 mmol/L (ref 20–29)
Calcium: 10.1 mg/dL (ref 8.7–10.2)
Chloride: 100 mmol/L (ref 96–106)
Creatinine, Ser: 0.93 mg/dL (ref 0.57–1.00)
Globulin, Total: 2.7 g/dL (ref 1.5–4.5)
Glucose: 82 mg/dL (ref 70–99)
Potassium: 4.6 mmol/L (ref 3.5–5.2)
Sodium: 138 mmol/L (ref 134–144)
Total Protein: 7.7 g/dL (ref 6.0–8.5)
eGFR: 77 mL/min/{1.73_m2} (ref >59)

## 2021-11-23 LAB — T4, FREE: Free T4: 1.22 ng/dL (ref 0.82–1.77)

## 2021-11-23 LAB — LDL CHOLESTEROL, DIRECT: LDL Direct: 146 mg/dL — ABNORMAL HIGH (ref 0–99)

## 2021-11-23 LAB — TSH: TSH: 4.33 u[IU]/mL (ref 0.450–4.500)

## 2021-11-28 ENCOUNTER — Encounter: Payer: BC Managed Care – PPO | Admitting: Family Medicine

## 2022-05-22 ENCOUNTER — Ambulatory Visit: Payer: BC Managed Care – PPO | Admitting: Family Medicine

## 2022-05-23 ENCOUNTER — Encounter: Payer: Self-pay | Admitting: Family Medicine

## 2022-06-20 ENCOUNTER — Other Ambulatory Visit: Payer: Self-pay | Admitting: Family Medicine

## 2022-06-20 DIAGNOSIS — F411 Generalized anxiety disorder: Secondary | ICD-10-CM

## 2022-07-04 ENCOUNTER — Encounter: Payer: Self-pay | Admitting: Family Medicine

## 2022-07-04 ENCOUNTER — Other Ambulatory Visit: Payer: Self-pay | Admitting: Family Medicine

## 2022-07-04 DIAGNOSIS — F411 Generalized anxiety disorder: Secondary | ICD-10-CM

## 2022-07-04 NOTE — Telephone Encounter (Signed)
Letter sent.

## 2022-07-04 NOTE — Telephone Encounter (Signed)
Refill denied, 30 day supply was sent in 06/20/22. Please schedule pt with PCP for further refills.

## 2022-09-19 ENCOUNTER — Encounter: Payer: Self-pay | Admitting: Family Medicine

## 2022-09-20 ENCOUNTER — Other Ambulatory Visit: Payer: Self-pay | Admitting: Family Medicine

## 2022-09-20 DIAGNOSIS — F411 Generalized anxiety disorder: Secondary | ICD-10-CM

## 2022-09-20 MED ORDER — PAROXETINE HCL 30 MG PO TABS: ORAL_TABLET | ORAL | 3 refills | Status: DC

## 2022-11-06 ENCOUNTER — Ambulatory Visit: Payer: BC Managed Care – PPO | Admitting: Family Medicine

## 2022-11-06 VITALS — BP 112/70 | HR 89 | Temp 97.0°F | Ht 64.0 in | Wt 209.8 lb

## 2022-11-06 DIAGNOSIS — G4733 Obstructive sleep apnea (adult) (pediatric): Secondary | ICD-10-CM

## 2022-11-06 DIAGNOSIS — E039 Hypothyroidism, unspecified: Secondary | ICD-10-CM | POA: Diagnosis not present

## 2022-11-06 DIAGNOSIS — Z23 Encounter for immunization: Secondary | ICD-10-CM

## 2022-11-06 DIAGNOSIS — G4710 Hypersomnia, unspecified: Secondary | ICD-10-CM | POA: Diagnosis not present

## 2022-11-06 DIAGNOSIS — F411 Generalized anxiety disorder: Secondary | ICD-10-CM | POA: Diagnosis not present

## 2022-11-06 DIAGNOSIS — F325 Major depressive disorder, single episode, in full remission: Secondary | ICD-10-CM

## 2022-11-06 MED ORDER — PAROXETINE HCL 30 MG PO TABS: ORAL_TABLET | ORAL | 3 refills | Status: DC

## 2022-11-06 MED ORDER — FLUTICASONE PROPIONATE 50 MCG/ACT NA SUSP: 2.0000 | Freq: Every day | NASAL | 6 refills | Status: AC

## 2022-11-06 MED ORDER — HYDROXYZINE HCL 25 MG PO TABS: 25.0000 mg | ORAL_TABLET | Freq: Every evening | ORAL | 3 refills | Status: DC | PRN

## 2022-11-06 MED ORDER — BUPROPION HCL ER (SR) 150 MG PO TB12: 150.0000 mg | ORAL_TABLET | Freq: Every morning | ORAL | 0 refills | Status: DC

## 2022-11-06 MED ORDER — LEVOTHYROXINE SODIUM 112 MCG PO TABS: 112.0000 ug | ORAL_TABLET | Freq: Every day | ORAL | 3 refills | Status: DC

## 2022-11-06 NOTE — Progress Notes (Signed)
Subjective: CC:f/u thyroid PCP: Janora Norlander, DO [id number] K Durr is a 47 y.o. female presenting to clinic today for:  1. Hypothyroidism/ Hypersomnia/ OSA Compliant with meds.  Continues to have hypersomnia during daytime hours despite compliance with CPAP.  Was under care of Dr Merlene Laughter but he retired.  Was treated with Provigil for hypersomnia.  Unclear if diagnosed with Narcolepsy.  She has been taking B vitamins in efforts to improve energy but is not having much success. Continues to fall asleep easily during the day.   ROS: Per HPI  Allergies  Allergen Reactions   Penicillins Anaphylaxis and Swelling    Has patient had a PCN reaction causing immediate rash, facial/tongue/throat swelling, SOB or lightheadedness with hypotension: Yes Has patient had a PCN reaction causing severe rash involving mucus membranes or skin necrosis: Yes Has patient had a PCN reaction that required hospitalization No Has patient had a PCN reaction occurring within the last 10 years: No If all of the above answers are "NO", then may proceed with Cephalosporin use.    Zoloft [Sertraline Hcl] Itching and Rash   Past Medical History:  Diagnosis Date   Anxiety    Colon polyps    Depression    Hypothyroidism    Sleep apnea     Current Outpatient Medications:    Vitamin D, Ergocalciferol, (DRISDOL) 1.25 MG (50000 UNIT) CAPS capsule, Take 1 capsule (50,000 Units total) by mouth every 7 (seven) days., Disp: 12 capsule, Rfl: 3   fluticasone (FLONASE) 50 MCG/ACT nasal spray, Place 2 sprays into both nostrils daily., Disp: 16 g, Rfl: 6   hydrOXYzine (ATARAX) 25 MG tablet, Take 1 tablet (25 mg total) by mouth at bedtime as needed., Disp: 90 tablet, Rfl: 3   levothyroxine (SYNTHROID) 112 MCG tablet, Take 1 tablet (112 mcg total) by mouth daily., Disp: 90 tablet, Rfl: 3   PARoxetine (PAXIL) 30 MG tablet, TAKE 2 TABLETS BY MOUTH EVERY MORNING, Disp: 180 tablet, Rfl: 3 Social History    Socioeconomic History   Marital status: Single    Spouse name: Not on file   Number of children: 3   Years of education: college   Highest education level: Not on file  Occupational History    Employer: [employer]  Tobacco Use   Smoking status: Former    Packs/day: 0.25    Years: 5.00    Total pack years: 1.25    Types: Cigarettes    Quit date: 06/21/2021    Years since quitting: 1.3   Smokeless tobacco: Never  Vaping Use   Vaping Use: Never used  Substance and Sexual Activity   Alcohol use: No   Drug use: No   Sexual activity: Yes    Birth control/protection: Other-see comments    Comment: TVH 2001  Other Topics Concern   Not on file  Social History Narrative   Not on file   Social Determinants of Health   Financial Resource Strain: Not on file  Food Insecurity: Not on file  Transportation Needs: Not on file  Physical Activity: Not on file  Stress: Not on file  Social Connections: Not on file  Intimate Partner Violence: Not on file   Family History  Problem Relation Age of Onset   Skin cancer Mother    Ovarian cancer Mother    Colon polyps Father    Hepatitis C Father    Skin cancer Father    Diabetes Maternal Grandmother    Colon cancer Neg Hx  Pancreatic cancer Neg Hx    Stomach cancer Neg Hx    Esophageal cancer Neg Hx     Objective: Office vital signs reviewed. BP 112/70   Pulse 89   Temp (!) 97 F (36.1 C)   Ht 5\' 4"  (1.626 m)   Wt 209 lb 12.8 oz (95.2 kg)   SpO2 97%   BMI 36.01 kg/m   Physical Examination:  General: Awake, alert, well nourished, No acute distress HEENT:  sclera white, MMM Cardio: regular rate and rhythm, S1S2 heard, no murmurs appreciated Pulm: clear to auscultation bilaterally, no wheezes, rhonchi or rales; normal work of breathing on room air Neuro: AOx3  Assessment/ Plan: 47 y.o. 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    Paxil renewed.  Wellbutrin added.  Will see if this helps with daytime sleepiness  I reviewed last sent note by Dr Merlene Laughter.  Unclear if he diagnosed with narcolepsy.  He was prescribing Provigil.  Patient interested in possible switch to Adderall, which I am agreeable with if clinically appropriate.  I am sending for consultation with neuro to determine actual diagnosis and recommendations for appropriate course of treatment.  In the meantime, continue CPAP as directed.  Tetanus administered  Orders Placed This Encounter  Procedures   CMP14+EGFR   TSH   T4, Free   Meds ordered this encounter  Medications   fluticasone (FLONASE) 50 MCG/ACT nasal spray    Sig: Place 2 sprays into both nostrils daily.    Dispense:  16 g    Refill:  6   hydrOXYzine (ATARAX) 25 MG tablet    Sig: Take 1 tablet (25 mg total) by mouth at bedtime as needed.    Dispense:  90 tablet    Refill:  3   levothyroxine (SYNTHROID) 112 MCG tablet    Sig: Take 1 tablet (112 mcg total) by mouth daily.    Dispense:  90 tablet    Refill:  3   PARoxetine (PAXIL) 30 MG tablet    Sig: TAKE 2 TABLETS BY MOUTH EVERY MORNING    Dispense:  180 tablet    Refill:  [place], DO [place] [phone]

## 2022-11-07 LAB — CMP14+EGFR
ALT: 23 IU/L (ref 0–32)
AST: 18 IU/L (ref 0–40)
Albumin/Globulin Ratio: 2.1 (ref 1.2–2.2)
Albumin: 4.6 g/dL (ref 3.9–4.9)
Alkaline Phosphatase: 73 IU/L (ref 44–121)
BUN/Creatinine Ratio: 17 (ref 9–23)
BUN: 14 mg/dL (ref 6–24)
Bilirubin Total: 0.9 mg/dL (ref 0.0–1.2)
CO2: 24 mmol/L (ref 20–29)
Calcium: 9.8 mg/dL (ref 8.7–10.2)
Chloride: 101 mmol/L (ref 96–106)
Creatinine, Ser: 0.84 mg/dL (ref 0.57–1.00)
Globulin, Total: 2.2 g/dL (ref 1.5–4.5)
Glucose: 95 mg/dL (ref 70–99)
Potassium: 4.2 mmol/L (ref 3.5–5.2)
Sodium: 139 mmol/L (ref 134–144)
Total Protein: 6.8 g/dL (ref 6.0–8.5)
eGFR: 86 mL/min/{1.73_m2} (ref >59)

## 2022-11-07 LAB — TSH: TSH: 1.78 u[IU]/mL (ref 0.450–4.500)

## 2022-11-07 LAB — T4, FREE: Free T4: 1.21 ng/dL (ref 0.82–1.77)

## 2022-12-02 ENCOUNTER — Telehealth: Payer: Self-pay

## 2022-12-02 NOTE — Telephone Encounter (Signed)
Received [facility] neurology records

## 2022-12-19 ENCOUNTER — Encounter: Payer: Self-pay | Admitting: Neurology

## 2022-12-19 ENCOUNTER — Ambulatory Visit (INDEPENDENT_AMBULATORY_CARE_PROVIDER_SITE_OTHER): Payer: BC Managed Care – PPO | Admitting: Neurology

## 2022-12-19 VITALS — BP 129/82 | HR 91 | Ht 64.0 in | Wt 211.2 lb

## 2022-12-19 DIAGNOSIS — G4733 Obstructive sleep apnea (adult) (pediatric): Secondary | ICD-10-CM | POA: Diagnosis not present

## 2022-12-19 DIAGNOSIS — G4719 Other hypersomnia: Secondary | ICD-10-CM | POA: Diagnosis not present

## 2022-12-19 DIAGNOSIS — R635 Abnormal weight gain: Secondary | ICD-10-CM | POA: Diagnosis not present

## 2022-12-19 DIAGNOSIS — E669 Obesity, unspecified: Secondary | ICD-10-CM

## 2022-12-19 NOTE — Progress Notes (Signed)
Subjective:    Patient ID: Krystal Mckee is a 47 y.o. female.  HPI    Krystal Age, MD, PhD [facility] [address] P.O. [place], [place] [address]  Dear Dr. Lajuana Ripple  I saw your patient, Krystal Mckee, upon your kind request in my sleep clinic today for initial consultation of her sleep disorder, in particular, evaluation of her prior diagnosis of obstructive sleep apnea.  The patient is unaccompanied today.  As you know, Krystal Mckee is a 47 year old female with an underlying medical history of vitamin D deficiency, hypothyroidism, anxiety, depression, irritable bowel syndrome, syncope, hyperlipidemia, and obesity, who was previously diagnosed with obstructive sleep apnea.  Her Epworth sleepiness score is 19 out of 24, fatigue severity score is 63 out of 63.  She does avoid driving longer distances, if she has to drive for longer distance, she has someone with her she states. I reviewed your office visit note from 11/06/2022.  She reported compliance with her CPAP at the time.  Of note, she reports that she has not used her CPAP in about 6 months, or [place] with the power cord.  She has not been in touch with her DME provider, [place] [facility] to get the machine looked at, she reports that after getting a replacement cord it still is not working.  She may not be eligible for new machine quite yet as she has not had the machine for over 5 years.  She got this machine after her sleep study in 2019.  She reports that her CPAP did not help with daytime sleepiness.  She denies telltale episodes of cataplexy or sleep paralysis.  She reports that her son has narcolepsy with cataplexy.  She is not aware of any family history of sleep apnea.  She lives with her fianc.  Weight has been more or less stable.  She currently weighs 211 and at the time of her sleep study in 2019 her weight was documented at 197.  She had a tonsillectomy at [facility] 37.   Bedtime is generally between 8 and 10 PM and rise time around 6 AM.  She does not have nightly nocturia.  She quit smoking some 5 years ago.  She does not currently consume any alcohol.  She drinks caffeine in the form of energy drinks occasionally.  She drinks about 40 ounces of diet Pepsi per day. She has not had formal evaluation for narcolepsy with sleep testing during the day.  She had a previous nighttime sleep study with her previous sleep specialist before Dr. Merlene Laughter. She is currently not on modafinil.  She was recently started on Wellbutrin.  She was previously followed by Dr. Merlene Laughter at [facility] neurology and I was able to review records from [facility] neurology.  She saw Dr. Merlene Laughter on 10/30/2021, and reported a prior diagnosis of sleep apnea.  She was on modafinil as needed at the time.  A compliance download does not documented from her CPAP machine at the time.  She had reportedly been diagnosed with sleep apnea by another physician in [place]. The patient saw Dr. Merlene Laughter on 07/23/2021, at which time a compliance download was not documented and the patient was on modafinil.   I reviewed her sleep study report from 07/28/2018.  She had a sleep study through [facility] sleep center. She had a split-night sleep study.  Baseline AHI was 31.5/h.  O2 nadir 79%.  She was titrated from 5 cm to 12 cm.  On the final  pressure of 12 cm of CPAP her AHI was 4.8/h, O2 nadir 93%.  Of note, she is currently not on her CPAP machine, she reports that her machine broke about 6 months ago.  A compliance download is not possible today.   Her Past Medical History Is Significant For: Past Medical History:  Diagnosis Date   Anxiety    Colon polyps    Depression    Hypothyroidism    Sleep apnea     Her Past Surgical History Is Significant For: Past Surgical History:  Procedure Laterality Date   ANTERIOR AND POSTERIOR REPAIR     CESAREAN SECTION     COLONOSCOPY  01/18/2013   CYST EXCISION     Right  wrist   CYST EXCISION     Left knee   FOOT FRACTURE SURGERY Right    PARTIAL HYSTERECTOMY     cervix ABSENT   TONSILLECTOMY AND ADENOIDECTOMY      Her Family History Is Significant For: Family History  Problem Relation Mckee of Onset   Skin cancer Mother    Ovarian cancer Mother    Colon polyps Father    Hepatitis C Father    Skin cancer Father    Diabetes Maternal Grandmother    Narcolepsy Son    Colon cancer Neg Hx    Pancreatic cancer Neg Hx    Stomach cancer Neg Hx    Esophageal cancer Neg Hx    Sleep apnea Neg Hx     Her Social History Is Significant For: Social History   Socioeconomic History   Marital status: Single    Spouse name: Not on file   Number of children: 3   Years of education: college   Highest education level: Not on file  Occupational History    Employer: [employer]  Tobacco Use   Smoking status: Former    Packs/day: 0.25    Years: 5.00    Total pack years: 1.25    Types: Cigarettes    Quit date: 06/21/2021    Years since quitting: 1.4   Smokeless tobacco: Never  Vaping Use   Vaping Use: Never used  Substance and Sexual Activity   Alcohol use: No   Drug use: No   Sexual activity: Yes    Birth control/protection: Other-see comments    Comment: [facility] 2001  Other Topics Concern   Not on file  Social History Narrative   Not on file   Social Determinants of Health   Financial Resource Strain: Not on file  Food Insecurity: Not on file  Transportation Needs: Not on file  Physical Activity: Not on file  Stress: Not on file  Social Connections: Not on file    Her Allergies Are:  Allergies  Allergen Reactions   Penicillins Anaphylaxis and Swelling    Has patient had a PCN reaction causing immediate rash, facial/tongue/throat swelling, SOB or lightheadedness with hypotension: Yes Has patient had a PCN reaction causing severe rash involving mucus membranes or skin necrosis: Yes Has patient had a PCN reaction that required hospitalization  No Has patient had a PCN reaction occurring within the last 10 years: No If all of the above answers are "NO", then may proceed with Cephalosporin use.    Zoloft [Sertraline Hcl] Itching and Rash  :   Her Current Medications Are:  Outpatient Encounter Medications as of 12/19/2022  Medication Sig   buPROPion (WELLBUTRIN SR) 150 MG 12 hr tablet Take 1 tablet (150 mg total) by mouth in the morning.  fluticasone (FLONASE) 50 MCG/ACT nasal spray Place 2 sprays into both nostrils daily.   hydrOXYzine (ATARAX) 25 MG tablet Take 1 tablet (25 mg total) by mouth at bedtime as needed.   levothyroxine (SYNTHROID) 112 MCG tablet Take 1 tablet (112 mcg total) by mouth daily.   PARoxetine (PAXIL) 30 MG tablet TAKE 2 TABLETS BY MOUTH EVERY MORNING   Vitamin D, Ergocalciferol, (DRISDOL) 1.25 MG (50000 UNIT) CAPS capsule Take 1 capsule (50,000 Units total) by mouth every 7 (seven) days.   No facility-administered encounter medications on file as of 12/19/2022.  :   Review of Systems:  Out of a complete 14 point review of systems, all are reviewed and negative with the exception of these symptoms as listed below:  Review of Systems  Neurological:        Pt here for sleep consult  Pt  snores,fatigue,headaches. Pt denies hypertension Pt states sleep study 2019 Pt  states CPAP is broken haven't used CPAP in six months    ESS:19 FSS:63    Objective:  Neurological Exam  Physical Exam Physical Examination:   Vitals:   12/19/22 1105  BP: 129/82  Pulse: 91    General Examination: The patient is a very pleasant 47 y.o. female in no acute distress. She appears well-developed and well-nourished and well groomed.   HEENT: Normocephalic, atraumatic, pupils are equal, round and reactive to light, extraocular tracking is good without limitation to gaze excursion or nystagmus noted. Hearing is grossly intact. Face is symmetric with normal facial animation. Speech is clear with no dysarthria noted. There  is no hypophonia. There is no lip, neck/head, jaw or voice tremor. Neck is supple with full range of passive and active motion. There are no carotid bruits on auscultation. Oropharynx exam reveals: mild mouth dryness, adequate dental hygiene and mild airway crowding, due to small airway entry and elongated uvula.  Mallampati class II.  Tonsils absent.  Neck circumference 16 inches.  Mild overbite noted.  Tongue protrudes centrally and palate elevates symmetrically.  Chest: Clear to auscultation without wheezing, rhonchi or crackles noted.  Heart: S1+S2+0, regular and normal without murmurs, rubs or gallops noted.   Abdomen: Soft, non-tender and non-distended.  Extremities: There is no pitting edema in the distal lower extremities bilaterally.   Skin: Warm and dry without trophic changes noted.   Musculoskeletal: exam reveals no obvious joint deformities.   Neurologically:  Mental status: The patient is awake, alert and oriented in all 4 spheres. Her immediate and remote memory, attention, language skills and fund of knowledge are appropriate. There is no evidence of aphasia, agnosia, apraxia or anomia. Speech is clear with normal prosody and enunciation. Thought process is linear. Mood is normal and affect is normal.  Cranial nerves II - XII are as described above under HEENT exam.  Motor exam: Normal bulk, strength and tone is noted. There is no obvious action or resting tremor.  Fine motor skills and coordination: grossly intact.  Cerebellar testing: No dysmetria or intention tremor. There is no truncal or gait ataxia.  Sensory exam: intact to light touch in the upper and lower extremities.  Gait, station and balance: She stands easily. No veering to one side is noted. No leaning to one side is noted. Posture is [place]-appropriate and stance is narrow based. Gait shows normal stride length and normal pace. No problems turning are noted.   Assessment and Plan:  In summary, RENDY TABB  is a very pleasant 47 y.o.-year old female with an  underlying medical history of vitamin D deficiency, hypothyroidism, anxiety, depression, irritable bowel syndrome, syncope, hyperlipidemia, and obesity, who presents for evaluation of her obstructive sleep apnea and daytime somnolence.  She has not been on her CPAP or AutoPap machine for the past 6 months approximately.  She reports that her machine broke about 6 months ago, and even after she received a new power cord it is not working.  She has not been in touch with her DME provider and is encouraged to do so.  She is encouraged to call them today to see if they can fix her machine or give her a loaner machine.  We will proceed with repeat sleep evaluation for her sleep apnea in the form of a home sleep test, she is agreeable.  Based on the test results I can make adjustments to her pressure or even write for a new machine if she is eligible.  She is reminded that severe obstructive sleep apnea when untreated can cause long-term complications.  Once she is re-established on her PAP machine, we can consider evaluation of her daytime somnolence with a nocturnal polysomnogram (on PAP therapy) with next day nap testing to see if she has significant residual daytime somnolence for which she may qualify for a wake promoting medication.  She recently started Wellbutrin, she reports that she has been on it for about 2 weeks, she reports that it has not really helped her daytime energy.  She is reminded that antidepressant medications have a tendency to take longer to kick in, up to 6 to 8 weeks at times.  We will call her once we have insurance approval for her home sleep test.  She is advised that she is at risk of falling asleep while driving and reminded not to drive when feeling sleepy.  We will plan a follow-up after sleep testing.  I answered all her questions today and she was in agreement.  Thank you very much for allowing me to participate in the care of this  nice patient. If I can be of any further assistance to you please do not hesitate to call me at [phone].  Sincerely,   Krystal Age, MD, PhD

## 2022-12-19 NOTE — Patient Instructions (Signed)
It was nice to meet you today.  As discussed, we will proceed with a home sleep test to reevaluate your sleep apnea and to hopefully qualify you for a new CPAP machine.  Please get in touch with your DME provider, [employer] as soon as possible to get your current machine looked at and to see if they can repair your defective machine or give you a loaner machine for now.  As you have severe sleep apnea and we do not recommend it remain untreated.  Please do not drive when feeling sleepy.  Try to continue to work on weight loss.  Once you are established on your CPAP machine, we can see how you feel, the Wellbutrin may also help with your daytime alertness and energy but it does take a few weeks to kick in, as long as 6 to 8 weeks even.  If you still have daytime sleepiness once you are on your CPAP machine and consistent with the usage, we can consider bringing you in for formal testing for daytime sleepiness with a nighttime sleep study in the next day nap testing.

## 2022-12-20 NOTE — Telephone Encounter (Signed)
See MyChart response.

## 2022-12-26 ENCOUNTER — Telehealth (INDEPENDENT_AMBULATORY_CARE_PROVIDER_SITE_OTHER): Payer: BC Managed Care – PPO | Admitting: Family

## 2022-12-26 ENCOUNTER — Encounter: Payer: Self-pay | Admitting: Family

## 2022-12-26 DIAGNOSIS — Z20818 Contact with and (suspected) exposure to other bacterial communicable diseases: Secondary | ICD-10-CM | POA: Diagnosis not present

## 2022-12-26 DIAGNOSIS — J029 Acute pharyngitis, unspecified: Secondary | ICD-10-CM | POA: Diagnosis not present

## 2022-12-26 MED ORDER — CLINDAMYCIN HCL 300 MG PO CAPS: 300.0000 mg | ORAL_CAPSULE | Freq: Three times a day (TID) | ORAL | 0 refills | Status: AC

## 2022-12-26 NOTE — Progress Notes (Signed)
Virtual Visit Consent   [name], you are scheduled for a virtual visit with a [facility] provider today. Just as with appointments in the office, your consent must be obtained to participate. Your consent will be active for this visit and any virtual visit you may have with one of our providers in the next 365 days. If you have a MyChart account, a copy of this consent can be sent to you electronically.  As this is a virtual visit, video technology does not allow for your provider to perform a traditional examination. This may limit your provider's ability to fully assess your condition. If your provider identifies any concerns that need to be evaluated in person or the need to arrange testing (such as labs, EKG, etc.), we will make arrangements to do so. Although advances in technology are sophisticated, we cannot ensure that it will always work on either your end or our end. If the connection with a video visit is poor, the visit may have to be switched to a telephone visit. With either a video or telephone visit, we are not always able to ensure that we have a secure connection.  By engaging in this virtual visit, you consent to the provision of healthcare and authorize for your insurance to be billed (if applicable) for the services provided during this visit. Depending on your insurance coverage, you may receive a charge related to this service.  I need to obtain your verbal consent now. Are you willing to proceed with your visit today? PAISLYN BUTCHER has provided verbal consent on 12/26/2022 for a virtual visit (video or telephone). Evelina Dun, FNP  Date: 12/26/2022 8:47 AM  Virtual Visit via Video Note   I, Evelina Dun, connected with  Krystal Mckee  ([id number], Oct 24, 1975) on 12/26/22 at  8:35 AM EST by a video-enabled telemedicine application and verified that I am speaking with the correct person using two identifiers.  Location: Patient: Virtual Visit  Location Patient: Home Provider: Virtual Visit Location Provider: Home Office   I discussed the limitations of evaluation and management by telemedicine and the availability of in person appointments. The patient expressed understanding and agreed to proceed.    History of Present Illness: Krystal Mckee is a 47 y.o. who identifies as a female who was assigned female at birth, and is being seen today for sore throat that started two days.  HPI: Sore Throat  This is a new problem. The current episode started in the past 7 days. The problem has been unchanged. There has been no fever. The pain is at a severity of 7/10. The pain is moderate. Associated symptoms include congestion, coughing, ear pain, headaches, swollen glands and trouble swallowing. Pertinent negatives include no shortness of breath (when laying down). She has had exposure to strep. Exposure to: nephew. She has tried NSAIDs and acetaminophen for the symptoms.    Problems:  Patient Active Problem List   Diagnosis Date Noted   History of colonic polyps 09/23/2018   Chronic constipation 09/23/2018   Obesity (BMI 30-39.9) 03/15/2016   Hyperlipidemia 08/31/2015   Vitamin D deficiency 02/07/2015   Hypothyroidism 05/31/2014   Depression 05/31/2014   GAD (generalized anxiety disorder) 05/31/2014   Syncope 02/18/2013   Dyspareunia 01/12/2013   IBS (irritable colon syndrome) 01/12/2013    Allergies:  Allergies  Allergen Reactions   Penicillins Anaphylaxis and Swelling    Has patient had a PCN reaction causing immediate rash, facial/tongue/throat swelling, SOB or lightheadedness with hypotension:  Yes Has patient had a PCN reaction causing severe rash involving mucus membranes or skin necrosis: Yes Has patient had a PCN reaction that required hospitalization No Has patient had a PCN reaction occurring within the last 10 years: No If all of the above answers are "NO", then may proceed with Cephalosporin use.    Zoloft  [Sertraline Hcl] Itching and Rash   Medications:  Current Outpatient Medications:    buPROPion (WELLBUTRIN SR) 150 MG 12 hr tablet, Take 1 tablet (150 mg total) by mouth in the morning., Disp: 90 tablet, Rfl: 0   clindamycin (CLEOCIN) 300 MG capsule, Take 1 capsule (300 mg total) by mouth 3 (three) times daily for 10 days., Disp: 30 capsule, Rfl: 0   fluticasone (FLONASE) 50 MCG/ACT nasal spray, Place 2 sprays into both nostrils daily., Disp: 16 g, Rfl: 6   hydrOXYzine (ATARAX) 25 MG tablet, Take 1 tablet (25 mg total) by mouth at bedtime as needed., Disp: 90 tablet, Rfl: 3   levothyroxine (SYNTHROID) 112 MCG tablet, Take 1 tablet (112 mcg total) by mouth daily., Disp: 90 tablet, Rfl: 3   PARoxetine (PAXIL) 30 MG tablet, TAKE 2 TABLETS BY MOUTH EVERY MORNING, Disp: 180 tablet, Rfl: 3   Vitamin D, Ergocalciferol, (DRISDOL) 1.25 MG (50000 UNIT) CAPS capsule, Take 1 capsule (50,000 Units total) by mouth every 7 (seven) days., Disp: 12 capsule, Rfl: 3  Observations/Objective: Patient is well-developed, well-nourished in no acute distress.  Resting comfortably  at home.  Head is normocephalic, atraumatic.  No labored breathing.  Speech is clear and coherent with logical content.  Patient is alert and oriented at baseline.  Nasal congestion   Assessment and Plan: 1. Acute pharyngitis, unspecified etiology - clindamycin (CLEOCIN) 300 MG capsule; Take 1 capsule (300 mg total) by mouth 3 (three) times daily for 10 days.  Dispense: 30 capsule; Refill: 0  2. Exposure to strep throat - clindamycin (CLEOCIN) 300 MG capsule; Take 1 capsule (300 mg total) by mouth 3 (three) times daily for 10 days.  Dispense: 30 capsule; Refill: 0  - Take meds as prescribed - Use a cool mist humidifier  -Use saline nose sprays frequently -Force fluids -For any cough or congestion  Use plain Mucinex- regular strength or max strength is fine -For fever or aces or pains- take tylenol or ibuprofen. -Throat lozenges  if help -New toothbrush in 3 days -Follow up if symptoms worsen or do not improve  Follow Up Instructions: I discussed the assessment and treatment plan with the patient. The patient was provided an opportunity to ask questions and all were answered. The patient agreed with the plan and demonstrated an understanding of the instructions.  A copy of instructions were sent to the patient via MyChart unless otherwise noted below.     The patient was advised to call back or seek an in-person evaluation if the symptoms worsen or if the condition fails to improve as anticipated.  Time:  I spent 8 minutes with the patient via telehealth technology discussing the above problems/concerns.    Evelina Dun, FNP

## 2023-01-14 ENCOUNTER — Telehealth: Payer: Self-pay | Admitting: Neurology

## 2023-01-14 NOTE — Telephone Encounter (Signed)
MAIL OUT HST- [employer] state no auth req   Patient is on the schedule for 01/22/2023

## 2023-01-22 ENCOUNTER — Ambulatory Visit: Payer: BC Managed Care – PPO | Admitting: Neurology

## 2023-01-22 DIAGNOSIS — R635 Abnormal weight gain: Secondary | ICD-10-CM

## 2023-01-22 DIAGNOSIS — G4719 Other hypersomnia: Secondary | ICD-10-CM

## 2023-01-22 DIAGNOSIS — G4733 Obstructive sleep apnea (adult) (pediatric): Secondary | ICD-10-CM

## 2023-01-22 DIAGNOSIS — E669 Obesity, unspecified: Secondary | ICD-10-CM

## 2023-01-30 NOTE — Procedures (Signed)
[facility]  HOME SLEEP TEST (Watch PAT) REPORT - Mail Out Device  STUDY DATE: 01/28/23  DOB: 12/11/1975  MRN: [id number]  ORDERING CLINICIAN: Huston Foley, MD, PhD   REFERRING CLINICIAN: Raliegh Ip, DO   CLINICAL INFORMATION/HISTORY: 47 year old female with an underlying medical history of vitamin D deficiency, hypothyroidism, anxiety, depression, irritable bowel syndrome, syncope, hyperlipidemia, and obesity, who was previously diagnosed with obstructive sleep apnea. She is currently not on PAP therapy.  Epworth sleepiness score: 19/24.  BMI: 36.1 kg/m  FINDINGS:   Sleep Summary:   Total Recording Time (hours, min): 8 hours, 38 min  Total Sleep Time (hours, min):  7 hours, 57 min  Percent REM (%):    31.3%   Respiratory Indices:   Calculated pAHI (per hour):  23.9/hour         REM pAHI:    46.2/hour       NREM pAHI: 13.8/hour  Central pAHI: 0.1/hour  Oxygen Saturation Statistics:    Oxygen Saturation (%) Mean: 93%   Minimum oxygen saturation (%):                 78%   O2 Saturation Range (%): 78 - 98%    O2 Saturation (minutes) <=88%: 7.9 min  Pulse Rate Statistics:   Pulse Mean (bpm):    71/min    Pulse Range (52 - 104/min)   IMPRESSION: OSA (obstructive sleep apnea), moderate  RECOMMENDATION:  This home sleep test demonstrates moderate obstructive sleep apnea with a total AHI of 23.9/hour and O2 nadir of 78%.  Moderate to loud snoring was detected fairly consistently throughout the night. Treatment with a positive airway pressure (PAP) device is recommended. The patient will be advised to proceed with an autoPAP titration/trial at home for now. A full night titration study may be considered to optimize treatment settings, monitor proper oxygen saturations and aid with improvement of tolerance and adherence, if needed down the road. Alternative treatment options may include a dental device through dentistry or orthodontics in  selected patients or Inspire (hypoglossal nerve stimulator) in carefully selected patients (meeting inclusion criteria).  Concomitant weight loss is recommended (where clinically appropriate). Please note that untreated obstructive sleep apnea may carry additional perioperative morbidity. Patients with significant obstructive sleep apnea should receive perioperative PAP therapy and the surgeons and particularly the anesthesiologist should be informed of the diagnosis and the severity of the sleep disordered breathing. The patient should be cautioned not to drive, work at heights, or operate dangerous or heavy equipment when tired or sleepy. Review and reiteration of good sleep hygiene measures should be pursued with any patient. Other causes of the patient's symptoms, including circadian rhythm disturbances, an underlying mood disorder, medication effect and/or an underlying medical problem cannot be ruled out based on this test. Clinical correlation is recommended.  The patient and her referring provider will be notified of the test results. The patient will be seen in follow up in sleep clinic at [facility].  I certify that I have reviewed the raw data recording prior to the issuance of this report in accordance with the standards of the American Academy of Sleep Medicine (AASM).    INTERPRETING PHYSICIAN:   Huston Foley, MD, PhD Medical Director, [facility]) Diplomat, ABPN (Neurology and Sleep)   [facility] [address] [place], [place] [address] [phone]

## 2023-02-05 ENCOUNTER — Telehealth: Payer: Self-pay

## 2023-02-05 DIAGNOSIS — G4733 Obstructive sleep apnea (adult) (pediatric): Secondary | ICD-10-CM

## 2023-02-05 NOTE — Telephone Encounter (Signed)
-----   Message from Huston Foley, MD sent at 01/30/2023  7:30 PM EDT ----- Patient was referred for reevaluation of her sleep apnea.  I saw her on 12/19/2022 at the request of her PCP.  She had a home sleep test on 01/28/2023 which indicated moderate obstructive sleep apnea.  She currently has a CPAP machine but has had trouble getting it to work properly.  Her DME company is [employer].  I would be happy to write for a new machine.  If it has been less than 5 years, they may also be able to look at the machine and fix it for her.  I would recommend a follow-up within 3 months.  Let me know if she would like to get back on her previous machine after talking to her DME company or if she would like a new order, which I can also place.

## 2023-02-05 NOTE — Telephone Encounter (Signed)
AutoPAP order placed in chart.

## 2023-02-05 NOTE — Addendum Note (Signed)
Addended by: Huston Foley on: 02/05/2023 05:13 PM   Modules accepted: Orders

## 2023-02-05 NOTE — Telephone Encounter (Addendum)
4:07pm Call to [place] at [facility]. Burna Mortimer states that patient has the AirSense10 and started using 11/18/2018 and last recorded use was 10/04/2021. The pressure they had documented was 12. I informed Burna Mortimer that patient states that she is having trouble with her machine working properly and Burna Mortimer states that patients account is frozen with [employer] and they will not repair until account is made current and then machine would be sent off for repair. Burna Mortimer stated that most of the time it is a mask issue when patients have complaints. I thanked Burna Mortimer for the information and stated I would follow up with patient as to what she wanted to do.    Call to patient, no answer. Left message to return call to office regarding sleep study results

## 2023-02-05 NOTE — Telephone Encounter (Addendum)
4:35 Patient returned call, gave results of sleep study. Informed that I had called [facility] and patient states she wants to purchase her own CPAP online from [email or web address] She also inquires on the oral appliance and I reviewed that Dr. Frances Furbish preferred method of treatment as the CPAP. Advised that I would let Dr. Frances Furbish know she requests to purchase online and that she would need to create an account and we would get the prescription emailed to her at her request at email on file.Patient appreciative of call.

## 2023-02-06 NOTE — Telephone Encounter (Signed)
AutoPAP order was emailed to patient at [email or web address]. Received a receipt of confirmation. Sent patient mychart message with compliance requirements.

## 2023-02-11 ENCOUNTER — Other Ambulatory Visit: Payer: Self-pay | Admitting: Family Medicine

## 2023-02-11 DIAGNOSIS — G479 Sleep disorder, unspecified: Secondary | ICD-10-CM

## 2023-02-11 MED ORDER — HYDROXYZINE PAMOATE 25 MG PO CAPS: 25.0000 mg | ORAL_CAPSULE | Freq: Every evening | ORAL | 0 refills | Status: DC | PRN

## 2023-02-11 NOTE — Telephone Encounter (Signed)
  hydrOXYzine (ATARAX) 10 MG/5ML syrup        Changed from: hydrOXYzine (ATARAX) 25 MG tablet   Pharmacy comment: Alternative Requested:DRUG NOT ON FORMULARY. PLEASE SEND PRIOR AUTH OR AN ALTERNATIVE. THANKS.   All Pharmacy Suggested Alternatives:  hydrOXYzine (ATARAX) 10 MG/5ML syrup hydrOXYzine (VISTARIL) 25 MG capsule

## 2023-02-16 ENCOUNTER — Other Ambulatory Visit: Payer: Self-pay | Admitting: Family Medicine

## 2023-02-16 DIAGNOSIS — G479 Sleep disorder, unspecified: Secondary | ICD-10-CM

## 2023-02-19 ENCOUNTER — Other Ambulatory Visit: Payer: Self-pay | Admitting: Family Medicine

## 2023-02-19 DIAGNOSIS — G4710 Hypersomnia, unspecified: Secondary | ICD-10-CM

## 2023-02-19 DIAGNOSIS — F325 Major depressive disorder, single episode, in full remission: Secondary | ICD-10-CM

## 2023-03-05 ENCOUNTER — Other Ambulatory Visit: Payer: Self-pay | Admitting: Family Medicine

## 2023-03-05 DIAGNOSIS — G4710 Hypersomnia, unspecified: Secondary | ICD-10-CM

## 2023-03-05 DIAGNOSIS — F325 Major depressive disorder, single episode, in full remission: Secondary | ICD-10-CM

## 2023-10-03 ENCOUNTER — Telehealth: Payer: BC Managed Care – PPO | Admitting: Physician Assistant

## 2023-10-03 ENCOUNTER — Telehealth: Payer: BC Managed Care – PPO

## 2023-10-03 DIAGNOSIS — J069 Acute upper respiratory infection, unspecified: Secondary | ICD-10-CM

## 2023-10-03 DIAGNOSIS — B9689 Other specified bacterial agents as the cause of diseases classified elsewhere: Secondary | ICD-10-CM

## 2023-10-03 MED ORDER — AZITHROMYCIN 250 MG PO TABS: ORAL_TABLET | ORAL | 0 refills | Status: AC

## 2023-10-03 MED ORDER — BENZONATATE 100 MG PO CAPS: 100.0000 mg | ORAL_CAPSULE | Freq: Three times a day (TID) | ORAL | 0 refills | Status: DC | PRN

## 2023-10-03 MED ORDER — PREDNISONE 10 MG (21) PO TBPK: ORAL_TABLET | ORAL | 0 refills | Status: DC

## 2023-10-03 NOTE — Progress Notes (Signed)
E-Visit for Cough  We are sorry that you are not feeling well.  Here is how we plan to help!  Based on your presentation I believe you most likely have A cough due to bacteria.  When patients have a fever and a productive cough with a change in color or increased sputum production, we are concerned about bacterial bronchitis.  If left untreated it can progress to pneumonia.  If your symptoms do not improve with your treatment plan it is important that you contact your provider.   I have prescribed Azithromyin 250 mg: two tablets now and then one tablet daily for 4 additonal days    In addition you may use A non-prescription cough medication called Mucinex DM: take 2 tablets every 12 hours. and A prescription cough medication called Tessalon Perles 100mg . You may take 1-2 capsules every 8 hours as needed for your cough.  Prednisone 10 mg daily for 6 days (see taper instructions below)  Directions for 6 day taper: Day 1: 2 tablets before breakfast, 1 after both lunch & dinner and 2 at bedtime Day 2: 1 tab before breakfast, 1 after both lunch & dinner and 2 at bedtime Day 3: 1 tab at each meal & 1 at bedtime Day 4: 1 tab at breakfast, 1 at lunch, 1 at bedtime Day 5: 1 tab at breakfast & 1 tab at bedtime Day 6: 1 tab at breakfast  From your responses in the eVisit questionnaire you describe inflammation in the upper respiratory tract which is causing a significant cough.  This is commonly called Bronchitis and has four common causes:   Allergies Viral Infections Acid Reflux Bacterial Infection Allergies, viruses and acid reflux are treated by controlling symptoms or eliminating the cause. An example might be a cough caused by taking certain blood pressure medications. You stop the cough by changing the medication. Another example might be a cough caused by acid reflux. Controlling the reflux helps control the cough.  USE OF BRONCHODILATOR ("RESCUE") INHALERS: There is a risk from using your  bronchodilator too frequently.  The risk is that over-reliance on a medication which only relaxes the muscles surrounding the breathing tubes can reduce the effectiveness of medications prescribed to reduce swelling and congestion of the tubes themselves.  Although you feel brief relief from the bronchodilator inhaler, your asthma may actually be worsening with the tubes becoming more swollen and filled with mucus.  This can delay other crucial treatments, such as oral steroid medications. If you need to use a bronchodilator inhaler daily, several times per day, you should discuss this with your provider.  There are probably better treatments that could be used to keep your asthma under control.     HOME CARE Only take medications as instructed by your medical team. Complete the entire course of an antibiotic. Drink plenty of fluids and get plenty of rest. Avoid close contacts especially the very young and the elderly Cover your mouth if you cough or cough into your sleeve. Always remember to wash your hands A steam or ultrasonic humidifier can help congestion.   GET HELP RIGHT AWAY IF: You develop worsening fever. You become short of breath You cough up blood. Your symptoms persist after you have completed your treatment plan MAKE SURE YOU  Understand these instructions. Will watch your condition. Will get help right away if you are not doing well or get worse.    Thank you for choosing an e-visit.  Your e-visit answers were reviewed by a  board certified advanced clinical practitioner to complete your personal care plan. Depending upon the condition, your plan could have included both over the counter or prescription medications.  Please review your pharmacy choice. Make sure the pharmacy is open so you can pick up prescription now. If there is a problem, you may contact your provider through [employer] and have the prescription routed to another pharmacy.  Your safety is important  to [place]. If you have drug allergies check your prescription carefully.   For the next 24 hours you can use MyChart to ask questions about today's visit, request a non-urgent call back, or ask for a work or school excuse. You will get an email in the next two days asking about your experience. I hope that your e-visit has been valuable and will speed your recovery.  I have spent 5 minutes in review of e-visit questionnaire, review and updating patient chart, medical decision making and response to patient.   Margaretann Loveless, PA-C

## 2023-11-07 ENCOUNTER — Other Ambulatory Visit: Payer: Self-pay | Admitting: Family Medicine

## 2023-11-07 DIAGNOSIS — E039 Hypothyroidism, unspecified: Secondary | ICD-10-CM

## 2023-11-07 DIAGNOSIS — F411 Generalized anxiety disorder: Secondary | ICD-10-CM

## 2023-11-07 MED ORDER — LEVOTHYROXINE SODIUM 112 MCG PO TABS: 112.0000 ug | ORAL_TABLET | Freq: Every day | ORAL | 0 refills | Status: DC

## 2023-11-07 MED ORDER — PAROXETINE HCL 30 MG PO TABS: ORAL_TABLET | ORAL | 0 refills | Status: DC

## 2023-11-07 NOTE — Telephone Encounter (Signed)
[facility] NTBS last OV 11/06/22 NO RF sent to pharmacy last OV greater than a year

## 2023-11-07 NOTE — Telephone Encounter (Signed)
Appt 2-10 with Dr. Nadine Counts

## 2023-11-07 NOTE — Addendum Note (Signed)
Addended by: Julious Payer D on: 11/07/2023 10:51 AM   Modules accepted: Orders

## 2023-11-12 ENCOUNTER — Other Ambulatory Visit: Payer: Self-pay | Admitting: Medical Genetics

## 2023-11-20 ENCOUNTER — Other Ambulatory Visit: Payer: Self-pay | Admitting: Family Medicine

## 2023-11-20 DIAGNOSIS — E039 Hypothyroidism, unspecified: Secondary | ICD-10-CM

## 2023-11-21 ENCOUNTER — Other Ambulatory Visit: Payer: Self-pay | Admitting: Family Medicine

## 2023-11-21 DIAGNOSIS — F411 Generalized anxiety disorder: Secondary | ICD-10-CM

## 2023-12-01 ENCOUNTER — Ambulatory Visit (INDEPENDENT_AMBULATORY_CARE_PROVIDER_SITE_OTHER): Payer: 59 | Admitting: Family Medicine

## 2023-12-01 VITALS — BP 118/69 | HR 84 | Temp 98.8°F | Ht 64.0 in | Wt 220.8 lb

## 2023-12-01 DIAGNOSIS — Z6837 Body mass index (BMI) 37.0-37.9, adult: Secondary | ICD-10-CM

## 2023-12-01 DIAGNOSIS — Z0000 Encounter for general adult medical examination without abnormal findings: Secondary | ICD-10-CM

## 2023-12-01 DIAGNOSIS — F411 Generalized anxiety disorder: Secondary | ICD-10-CM

## 2023-12-01 DIAGNOSIS — G479 Sleep disorder, unspecified: Secondary | ICD-10-CM

## 2023-12-01 DIAGNOSIS — E559 Vitamin D deficiency, unspecified: Secondary | ICD-10-CM

## 2023-12-01 DIAGNOSIS — G4733 Obstructive sleep apnea (adult) (pediatric): Secondary | ICD-10-CM | POA: Diagnosis not present

## 2023-12-01 DIAGNOSIS — Z0001 Encounter for general adult medical examination with abnormal findings: Secondary | ICD-10-CM

## 2023-12-01 DIAGNOSIS — E782 Mixed hyperlipidemia: Secondary | ICD-10-CM

## 2023-12-01 DIAGNOSIS — E039 Hypothyroidism, unspecified: Secondary | ICD-10-CM

## 2023-12-01 DIAGNOSIS — K760 Fatty (change of) liver, not elsewhere classified: Secondary | ICD-10-CM

## 2023-12-01 DIAGNOSIS — F325 Major depressive disorder, single episode, in full remission: Secondary | ICD-10-CM

## 2023-12-01 MED ORDER — ZEPBOUND 2.5 MG/0.5ML ~~LOC~~ SOAJ: 2.5000 mg | SUBCUTANEOUS | 0 refills | Status: DC

## 2023-12-01 MED ORDER — ZEPBOUND 10 MG/0.5ML ~~LOC~~ SOAJ: 10.0000 mg | SUBCUTANEOUS | 0 refills | Status: DC

## 2023-12-01 MED ORDER — LEVOTHYROXINE SODIUM 112 MCG PO TABS: 112.0000 ug | ORAL_TABLET | Freq: Every day | ORAL | 3 refills | Status: DC

## 2023-12-01 MED ORDER — HYDROXYZINE PAMOATE 25 MG PO CAPS: 25.0000 mg | ORAL_CAPSULE | Freq: Every evening | ORAL | 3 refills | Status: AC | PRN

## 2023-12-01 MED ORDER — ZEPBOUND 7.5 MG/0.5ML ~~LOC~~ SOAJ: 7.5000 mg | SUBCUTANEOUS | 0 refills | Status: DC

## 2023-12-01 MED ORDER — PAROXETINE HCL 30 MG PO TABS: ORAL_TABLET | ORAL | Status: DC

## 2023-12-01 MED ORDER — ZEPBOUND 5 MG/0.5ML ~~LOC~~ SOAJ: 5.0000 mg | SUBCUTANEOUS | 0 refills | Status: DC

## 2023-12-01 NOTE — Progress Notes (Signed)
 Krystal Mckee is a 48 y.o. female presents to office today for annual physical exam examination.    Concerns today include: 1.  Obesity associated with sleep apnea, hyperlipidemia and fatty liver Patient is compliant with CPAP machine.  She continues to suffer from fatigue despite adequate sleep.  She reports joint pain.  She has been intermittently fasting for over a month now and exercising regularly with really no substantial weight loss.  She tried multiple modalities of weight loss attempts in the past including weight watchers, Hydroxycut, food logging, calorie counting, restriction etc. but nothing really is helping.  Occupation: Works for school, Substance use: None Health Maintenance Due  Topic Date Due   MAMMOGRAM  10/16/2017   Refills needed today: All  Immunization History  Administered Date(s) Administered   Influenza,inj,Quad PF,6+ Mos 08/29/2015, 09/25/2016, 07/25/2019   Influenza-Unspecified 06/16/2019   Td 11/06/2022   Tdap 04/30/2012   Past Medical History:  Diagnosis Date   Anxiety    Colon polyps    Depression    Hypothyroidism    Sleep apnea    Social History   Socioeconomic History   Marital status: Single    Spouse name: Not on file   Number of children: 3   Years of education: college   Highest education level: Bachelor's degree (e.g., BA, AB, BS)  Occupational History    Employer: [employer]  Tobacco Use   Smoking status: Former    Current packs/day: 0.00    Average packs/day: 0.3 packs/day for 5.0 years (1.3 ttl pk-yrs)    Types: Cigarettes    Start date: 06/21/2016    Quit date: 06/21/2021    Years since quitting: 2.4   Smokeless tobacco: Never  Vaping Use   Vaping status: Never Used  Substance and Sexual Activity   Alcohol use: No   Drug use: No   Sexual activity: Yes    Birth control/protection: Other-see comments    Comment: TVH 2001  Other Topics Concern   Not on file  Social History Narrative   Not on file   Social Drivers  of Health   Financial Resource Strain: Low Risk  (12/01/2023)   Overall Financial Resource Strain (CARDIA)    Difficulty of Paying Living Expenses: Not hard at all  Food Insecurity: Unknown (12/01/2023)   Hunger Vital Sign    Worried About Running Out of Food in the Last Year: Never true    Ran Out of Food in the Last Year: Not on file  Transportation Needs: No Transportation Needs (12/01/2023)   PRAPARE - Administrator, Civil Service (Medical): No    Lack of Transportation (Non-Medical): No  Physical Activity: Sufficiently Active (12/01/2023)   Exercise Vital Sign    Days of Exercise per Week: 5 days    Minutes of Exercise per Session: 70 min  Stress: Stress Concern Present (12/01/2023)   [employer] of Occupational Health - Occupational Stress Questionnaire    Feeling of Stress : To some extent  Social Connections: Socially Isolated (12/01/2023)   Social Connection and Isolation Panel [NHANES]    Frequency of Communication with Friends and Family: More than three times a week    Frequency of Social Gatherings with Friends and Family: More than three times a week    Attends Religious Services: Never    Database administrator or Organizations: No    Attends Engineer, structural: Not on file    Marital Status: Divorced  Intimate Partner Violence: Unknown (  07/18/2022)   Received from [facility]   HITS    Physically Hurt: Not on file    Insult or Talk Down To: Not on file    Threaten Physical Harm: Not on file    Scream or Curse: Not on file   Past Surgical History:  Procedure Laterality Date   ANTERIOR AND POSTERIOR REPAIR     CESAREAN SECTION     COLONOSCOPY  01/18/2013   CYST EXCISION     Right wrist   CYST EXCISION     Left knee   FOOT FRACTURE SURGERY Right    PARTIAL HYSTERECTOMY     cervix ABSENT   TONSILLECTOMY AND ADENOIDECTOMY     Family History  Problem Relation Age of Onset   Skin cancer Mother    Ovarian cancer  Mother    Colon polyps Father    Hepatitis C Father    Skin cancer Father    Diabetes Maternal Grandmother    Narcolepsy Son    Colon cancer Neg Hx    Pancreatic cancer Neg Hx    Stomach cancer Neg Hx    Esophageal cancer Neg Hx    Sleep apnea Neg Hx     Current Outpatient Medications:    fluticasone  (FLONASE ) 50 MCG/ACT nasal spray, Place 2 sprays into both nostrils daily., Disp: 16 g, Rfl: 6   PARoxetine  (PAXIL ) 30 MG tablet, TAKE 2 TABLETS BY MOUTH EVERY MORNING, Disp: 180 tablet, Rfl: 0   Vitamin D , Ergocalciferol , (DRISDOL ) 1.25 MG (50000 UNIT) CAPS capsule, Take 1 capsule (50,000 Units total) by mouth every 7 (seven) days., Disp: 12 capsule, Rfl: 3   hydrOXYzine  (VISTARIL ) 25 MG capsule, Take 1 capsule (25 mg total) by mouth at bedtime as needed., Disp: 90 capsule, Rfl: 3   levothyroxine  (SYNTHROID ) 112 MCG tablet, Take 1 tablet (112 mcg total) by mouth daily., Disp: 90 tablet, Rfl: 3  Allergies  Allergen Reactions   Penicillins Anaphylaxis and Swelling    Has patient had a PCN reaction causing immediate rash, facial/tongue/throat swelling, SOB or lightheadedness with hypotension: Yes Has patient had a PCN reaction causing severe rash involving mucus membranes or skin necrosis: Yes Has patient had a PCN reaction that required hospitalization No Has patient had a PCN reaction occurring within the last 10 years: No If all of the above answers are "NO", then may proceed with Cephalosporin use.    Zoloft  [Sertraline  Hcl] Itching and Rash     ROS: Review of Systems Pertinent items noted in HPI and remainder of comprehensive ROS otherwise negative.    Physical exam BP 118/69   Pulse 84   Temp 98.8 F (37.1 C)   Ht 5\' 4"  (1.626 m)   Wt 220 lb 12.8 oz (100.2 kg)   SpO2 96%   BMI 37.90 kg/m  General appearance: alert, cooperative, appears stated age, no distress, and morbidly obese Head: Normocephalic, without obvious abnormality, atraumatic Eyes: negative findings: lids  and lashes normal, conjunctivae and sclerae normal, corneas clear, and pupils equal, round, reactive to light and accomodation Ears: normal TM's and external ear canals both ears Nose: Nares normal. Septum midline. Mucosa normal. No drainage or sinus tenderness. Throat: lips, mucosa, and tongue normal; teeth and gums normal Neck: no adenopathy, supple, symmetrical, trachea midline, and thyroid  not enlarged, symmetric, no tenderness/mass/nodules Back: symmetric, no curvature. ROM normal. No CVA tenderness. Lungs: clear to auscultation bilaterally Heart: regular rate and rhythm, S1, S2 normal, no murmur, click, rub or gallop Abdomen:  soft, non-tender; bowel sounds normal; no masses,  no organomegaly Extremities: extremities normal, atraumatic, no cyanosis or edema Pulses: 2+ and symmetric Skin: Skin color, texture, turgor normal. No rashes or lesions Lymph nodes: Cervical, supraclavicular, and axillary nodes normal. Neurologic: Grossly normal         12/01/2023    3:49 PM 11/06/2022    4:11 PM 11/22/2021    3:51 PM  Depression screen PHQ 2/9  Decreased Interest 1 1 1   Down, Depressed, Hopeless 1 1 1   PHQ - 2 Score 2 2 2   Altered sleeping 1 2 1   Tired, decreased energy 3 3 0  Change in appetite 0 1 0  Feeling bad or failure about yourself  0 1 0  Trouble concentrating 3 3 1   Moving slowly or fidgety/restless 1 0 0  Suicidal thoughts 0 0 0  PHQ-9 Score 10 12 4   Difficult doing work/chores Somewhat difficult Extremely dIfficult Somewhat difficult      12/01/2023    3:48 PM 11/06/2022    4:11 PM 11/22/2021    3:51 PM 05/29/2021    9:43 AM  GAD 7 : Generalized Anxiety Score  Nervous, Anxious, on Edge 1  1 2   Control/stop worrying 2 1 1 2   Worry too much - different things 2 1 1 2   Trouble relaxing 2 2 1 2   Restless 1 2 1 1   Easily annoyed or irritable 2 1 1 2   Afraid - awful might happen 0 1 1 2   Total GAD 7 Score 10  7 13   Anxiety Difficulty  Somewhat difficult Somewhat difficult  Somewhat difficult     Assessment/ Plan: Krystal Mckee here for annual physical exam.   Annual physical exam  OSA on CPAP - Plan: tirzepatide  (ZEPBOUND ) 2.5 MG/0.5ML Pen, tirzepatide  (ZEPBOUND ) 7.5 MG/0.5ML Pen, tirzepatide  (ZEPBOUND ) 10 MG/0.5ML Pen, tirzepatide  (ZEPBOUND ) 5 MG/0.5ML Pen  Morbid obesity (HCC) - Plan: tirzepatide  (ZEPBOUND ) 2.5 MG/0.5ML Pen, tirzepatide  (ZEPBOUND ) 7.5 MG/0.5ML Pen, tirzepatide  (ZEPBOUND ) 10 MG/0.5ML Pen, tirzepatide  (ZEPBOUND ) 5 MG/0.5ML Pen  BMI 37.0-37.9, adult - Plan: tirzepatide  (ZEPBOUND ) 2.5 MG/0.5ML Pen, tirzepatide  (ZEPBOUND ) 7.5 MG/0.5ML Pen, tirzepatide  (ZEPBOUND ) 10 MG/0.5ML Pen, tirzepatide  (ZEPBOUND ) 5 MG/0.5ML Pen  Moderate mixed hyperlipidemia not requiring statin therapy - Plan: CMP14+EGFR, Lipid Panel, tirzepatide  (ZEPBOUND ) 2.5 MG/0.5ML Pen, tirzepatide  (ZEPBOUND ) 7.5 MG/0.5ML Pen, tirzepatide  (ZEPBOUND ) 10 MG/0.5ML Pen, tirzepatide  (ZEPBOUND ) 5 MG/0.5ML Pen  Fatty liver - Plan: tirzepatide  (ZEPBOUND ) 2.5 MG/0.5ML Pen, tirzepatide  (ZEPBOUND ) 7.5 MG/0.5ML Pen, tirzepatide  (ZEPBOUND ) 10 MG/0.5ML Pen, tirzepatide  (ZEPBOUND ) 5 MG/0.5ML Pen  Acquired hypothyroidism - Plan: TSH + free T4, levothyroxine  (SYNTHROID ) 112 MCG tablet  GAD (generalized anxiety disorder) - Plan: PARoxetine  (PAXIL ) 30 MG tablet  Major depressive disorder with single episode, in full remission (HCC) - Plan: tirzepatide  (ZEPBOUND ) 2.5 MG/0.5ML Pen, tirzepatide  (ZEPBOUND ) 7.5 MG/0.5ML Pen, tirzepatide  (ZEPBOUND ) 10 MG/0.5ML Pen, tirzepatide  (ZEPBOUND ) 5 MG/0.5ML Pen  Vitamin D  deficiency - Plan: CMP14+EGFR, VITAMIN D  25 Hydroxy (Vit-D Deficiency, Fractures)  Sleep disturbance - Plan: hydrOXYzine  (VISTARIL ) 25 MG capsule  Going to see if we can get Zepbound .  She has failed multiple modalities of weight loss attempts including intermittent fasting, calorie counting, restrictive food intake, weight watchers, Hydroxycut, food diary.  She is already involved in a  walking program where she walks 3 miles per day and cycles 3 to 4 days/week for at least 30 minutes.  Sadly very little weight loss.  Compliant with CPAP machine.  Suffering from chronic fatigue, joint pain etc.  Has known fatty liver and  has hyperlipidemia.  She has no apparent contraindications to utilization of Zepbound . Body mass index is 37.9 kg/m.  Fasting labs collected today.  Further intervention pending results  She is going to start trying to wean off Paxil  as she finds this does contribute to some of her fatigue.  Okay to reduce by 50% for 1 month and then by additional 50% for 1 month then every other day then off.  She will let me know if she wants to transition to alternative.  Could consider Prozac versus SNRI  Counseled on healthy lifestyle choices, including diet (rich in fruits, vegetables and lean meats and low in salt and simple carbohydrates) and exercise (at least 30 minutes of moderate physical activity daily).  Patient to follow up 3 to 73-month follow-up for weight recheck.  Krystal Gamino M. Bonnell Butcher, DO

## 2023-12-01 NOTE — Patient Instructions (Addendum)
 Mobile mammogram with [facility] can be scheduled at discharge today [email or web address]  Tips for success with Zepbound  (and by success, how not to be super sick on your stomach): Eat small meals AVOID heavy foods (fried/ high in carbs like bread, pasta, rice) AVOID carbonated beverages (soda/ beer, as these can increase bloating) DOUBLE your water intake (will help you avoid constipation/ dehydration)  Zepbound  CAN cause: Nausea Abdominal pain Increased acid reflux (sometimes presents as "sour burps") Constipation OR Diarrhea Fatigue (especially when you first start it)

## 2023-12-02 ENCOUNTER — Encounter: Payer: Self-pay | Admitting: Family Medicine

## 2023-12-02 ENCOUNTER — Telehealth: Payer: Self-pay | Admitting: Family Medicine

## 2023-12-02 ENCOUNTER — Other Ambulatory Visit: Payer: Self-pay | Admitting: Family Medicine

## 2023-12-02 ENCOUNTER — Other Ambulatory Visit (HOSPITAL_COMMUNITY)
Admission: RE | Admit: 2023-12-02 | Discharge: 2023-12-02 | Disposition: A | Discharge status: Home / Self Care | Payer: Self-pay | Source: Ambulatory Visit | Attending: Oncology | Admitting: Oncology

## 2023-12-02 DIAGNOSIS — E039 Hypothyroidism, unspecified: Secondary | ICD-10-CM

## 2023-12-02 DIAGNOSIS — E782 Mixed hyperlipidemia: Secondary | ICD-10-CM

## 2023-12-02 DIAGNOSIS — E559 Vitamin D deficiency, unspecified: Secondary | ICD-10-CM

## 2023-12-02 LAB — LIPID PANEL
Chol/HDL Ratio: 5.6 {ratio} — ABNORMAL HIGH (ref 0.0–4.4)
Cholesterol, Total: 209 mg/dL — ABNORMAL HIGH (ref 100–199)
HDL: 37 mg/dL — ABNORMAL LOW (ref >39)
LDL Chol Calc (NIH): 95 mg/dL (ref 0–99)
Triglycerides: 463 mg/dL — ABNORMAL HIGH (ref 0–149)
VLDL Cholesterol Cal: 77 mg/dL — ABNORMAL HIGH (ref 5–40)

## 2023-12-02 LAB — CMP14+EGFR
ALT: 28 [IU]/L (ref 0–32)
AST: 27 [IU]/L (ref 0–40)
Albumin: 4.6 g/dL (ref 3.9–4.9)
Alkaline Phosphatase: 68 [IU]/L (ref 44–121)
BUN/Creatinine Ratio: 16 (ref 9–23)
BUN: 14 mg/dL (ref 6–24)
Bilirubin Total: 1 mg/dL (ref 0.0–1.2)
CO2: 21 mmol/L (ref 20–29)
Calcium: 9.4 mg/dL (ref 8.7–10.2)
Chloride: 102 mmol/L (ref 96–106)
Creatinine, Ser: 0.87 mg/dL (ref 0.57–1.00)
Globulin, Total: 2.4 g/dL (ref 1.5–4.5)
Glucose: 87 mg/dL (ref 70–99)
Potassium: 3.9 mmol/L (ref 3.5–5.2)
Sodium: 140 mmol/L (ref 134–144)
Total Protein: 7 g/dL (ref 6.0–8.5)
eGFR: 82 mL/min/{1.73_m2} (ref >59)

## 2023-12-02 LAB — TSH+FREE T4
Free T4: 0.87 ng/dL (ref 0.82–1.77)
TSH: 11.3 u[IU]/mL — ABNORMAL HIGH (ref 0.450–4.500)

## 2023-12-02 LAB — VITAMIN D 25 HYDROXY (VIT D DEFICIENCY, FRACTURES): Vit D, 25-Hydroxy: 19.9 ng/mL — ABNORMAL LOW (ref 30.0–100.0)

## 2023-12-02 MED ORDER — VITAMIN D (ERGOCALCIFEROL) 1.25 MG (50000 UNIT) PO CAPS
50000.0000 [IU] | ORAL_CAPSULE | ORAL | 3 refills | Status: AC
Start: 2023-12-02 — End: ?

## 2023-12-02 NOTE — Telephone Encounter (Signed)
Lets get her to come in for thyroid check only lab in 4 weeks then.

## 2023-12-02 NOTE — Telephone Encounter (Signed)
APPT SCHEDULED FOR LABS 12/30/23

## 2023-12-02 NOTE — Telephone Encounter (Unsigned)
Copied from CRM [id number]. Topic: Appointments - Scheduling Inquiry for Clinic >> Dec 02, 2023 11:58 AM Krystal Mckee wrote: Reason for CRM: Patient is calling to make an appointment to get lab work done in 4 weeks.

## 2023-12-04 ENCOUNTER — Other Ambulatory Visit (HOSPITAL_COMMUNITY): Payer: Self-pay

## 2023-12-10 ENCOUNTER — Other Ambulatory Visit: Payer: Self-pay | Admitting: Medical Genetics

## 2023-12-10 DIAGNOSIS — Z006 Encounter for examination for normal comparison and control in clinical research program: Secondary | ICD-10-CM

## 2023-12-10 NOTE — Progress Notes (Signed)
 New order requested. Confirmed consent on file.

## 2023-12-18 ENCOUNTER — Other Ambulatory Visit (HOSPITAL_COMMUNITY): Payer: Self-pay

## 2023-12-19 ENCOUNTER — Other Ambulatory Visit (HOSPITAL_COMMUNITY)
Admission: RE | Admit: 2023-12-19 | Discharge: 2023-12-19 | Disposition: A | Discharge status: Home / Self Care | Payer: Self-pay | Source: Ambulatory Visit | Attending: Oncology | Admitting: Oncology

## 2023-12-19 DIAGNOSIS — Z006 Encounter for examination for normal comparison and control in clinical research program: Secondary | ICD-10-CM | POA: Insufficient documentation

## 2023-12-30 ENCOUNTER — Other Ambulatory Visit: Payer: 59

## 2023-12-30 DIAGNOSIS — E559 Vitamin D deficiency, unspecified: Secondary | ICD-10-CM

## 2023-12-30 DIAGNOSIS — E039 Hypothyroidism, unspecified: Secondary | ICD-10-CM

## 2023-12-30 DIAGNOSIS — E782 Mixed hyperlipidemia: Secondary | ICD-10-CM

## 2023-12-30 LAB — GENECONNECT MOLECULAR SCREEN: Genetic Analysis Overall Interpretation: NEGATIVE

## 2023-12-31 ENCOUNTER — Encounter: Payer: Self-pay | Admitting: Family Medicine

## 2023-12-31 LAB — TSH+FREE T4
Free T4: 1.08 ng/dL (ref 0.82–1.77)
TSH: 1.92 u[IU]/mL (ref 0.450–4.500)

## 2023-12-31 LAB — LIPID PANEL
Chol/HDL Ratio: 5.4 ratio — ABNORMAL HIGH (ref 0.0–4.4)
Cholesterol, Total: 204 mg/dL — ABNORMAL HIGH (ref 100–199)
HDL: 38 mg/dL — ABNORMAL LOW (ref >39)
LDL Chol Calc (NIH): 126 mg/dL — ABNORMAL HIGH (ref 0–99)
Triglycerides: 228 mg/dL — ABNORMAL HIGH (ref 0–149)
VLDL Cholesterol Cal: 40 mg/dL (ref 5–40)

## 2023-12-31 LAB — VITAMIN D 25 HYDROXY (VIT D DEFICIENCY, FRACTURES): Vit D, 25-Hydroxy: 29.8 ng/mL — ABNORMAL LOW (ref 30.0–100.0)

## 2024-02-23 ENCOUNTER — Ambulatory Visit: Admitting: Nurse Practitioner

## 2024-02-23 ENCOUNTER — Ambulatory Visit: Payer: Self-pay | Admitting: Family Medicine

## 2024-02-23 ENCOUNTER — Encounter: Payer: Self-pay | Admitting: Nurse Practitioner

## 2024-02-23 VITALS — BP 105/70 | HR 73 | Temp 97.8°F | Ht 64.0 in | Wt 214.8 lb

## 2024-02-23 DIAGNOSIS — M545 Low back pain, unspecified: Secondary | ICD-10-CM | POA: Diagnosis not present

## 2024-02-23 HISTORY — DX: Low back pain, unspecified: M54.50

## 2024-02-23 MED ORDER — PREDNISONE 10 MG (21) PO TBPK: ORAL_TABLET | ORAL | 0 refills | Status: DC

## 2024-02-23 MED ORDER — KETOROLAC TROMETHAMINE 30 MG/ML IJ SOLN
30.0000 mg | Freq: Once | INTRAMUSCULAR | Status: AC
Start: 2024-02-23 — End: 2024-02-23
  Administered 2024-02-23: 30 mg via INTRAMUSCULAR

## 2024-02-23 MED ORDER — CYCLOBENZAPRINE HCL 5 MG PO TABS: 5.0000 mg | ORAL_TABLET | Freq: Every day | ORAL | 1 refills | Status: DC

## 2024-02-23 NOTE — Progress Notes (Signed)
 Acute Office Visit  Subjective:     Patient ID: Krystal Mckee, female    DOB: Feb 20, 1976, 48 y.o.   MRN: [id number]  Chief Complaint  Patient presents with   Back Pain    Lower back pain since Wednesday, started on right side but has moved across whole lower back. Pain is worse with movement better when laying flat     HPI Krystal Mckee is 48 yrs old female present today concerns for low back pain for a few   Back Pain: Patient presents for presents evaluation of low back problems.  Symptoms have been present for 4 days and include pain in lower back (stabbing in character; 10/10 in severity) 10 or more with movement and 0/10 stiting" Initial inciting event: none. Symptoms are worst: doesn't matter what time of the day. Alleviating factors identifiable by patient are sitting. Exacerbating factors identifiable by patient are standing, walking, and moving . Treatments so far initiated by patient:  lidocaine pathches, Naprosyn , heating pad  Previous lower back problems: none. Previous workup: none. Previous treatments: none.  Active Ambulatory Problems    Diagnosis Date Noted   Dyspareunia 01/12/2013   IBS (irritable colon syndrome) 01/12/2013   Syncope 02/18/2013   Hypothyroidism 05/31/2014   Depression 05/31/2014   GAD (generalized anxiety disorder) 05/31/2014   Vitamin D  deficiency 02/07/2015   Hyperlipidemia 08/31/2015   Obesity (BMI 30-39.9) 03/15/2016   History of colonic polyps 09/23/2018   Chronic constipation 09/23/2018   Resolved Ambulatory Problems    Diagnosis Date Noted   Urinary frequency 08/23/2013   Past Medical History:  Diagnosis Date   Anxiety    Colon polyps    Sleep apnea     ROS Negative unless indicated in HPI    Objective:    BP 105/70   Pulse 73   Temp 97.8 F (36.6 C) (Temporal)   Ht 5\' 4"  (1.626 m)   Wt 214 lb 12.8 oz (97.4 kg)   SpO2 96%   BMI 36.87 kg/m  BP Readings from Last 3 Encounters:  02/23/24 105/70  12/01/23  118/69  12/19/22 129/82   Wt Readings from Last 3 Encounters:  02/23/24 214 lb 12.8 oz (97.4 kg)  12/01/23 220 lb 12.8 oz (100.2 kg)  12/19/22 211 lb 3.2 oz (95.8 kg)      Physical Exam Vitals and nursing note reviewed.  Constitutional:      General: She is not in acute distress. HENT:     Head: Normocephalic and atraumatic.     Nose: Nose normal.  Eyes:     Extraocular Movements: Extraocular movements intact.     Conjunctiva/sclera: Conjunctivae normal.     Pupils: Pupils are equal, round, and reactive to light.  Cardiovascular:     Heart sounds: Normal heart sounds.  Pulmonary:     Effort: Pulmonary effort is normal.     Breath sounds: Normal breath sounds.  Musculoskeletal:     Lumbar back: Positive left straight leg raise test.  Skin:    General: Skin is warm and dry.     Findings: No rash.  Neurological:     Mental Status: She is alert and oriented to person, place, and time.  Psychiatric:        Mood and Affect: Mood normal.        Behavior: Behavior normal.        Thought Content: Thought content normal.        Judgment: Judgment normal.  No results found for any visits on 02/23/24.      Assessment & Plan:  There are no diagnoses linked to this encounter.  Bridgett is a 48 yrs old Caucasian female seen today for back pain, no acute distress Back pain: IM Toradol  30 mg;  Flexeril  5 mg PRN at bedtime, Prednisone  10 mg #21 Heat 15 min's as tolerated; except at bedtime Future: may consider x-ray is no improvement   The above assessment and management plan was discussed with the patient. The patient verbalized understanding of and has agreed to the management plan. Patient is aware to call the clinic if they develop any new symptoms or if symptoms persist or worsen. Patient is aware when to return to the clinic for a follow-up visit. Patient educated on when it is appropriate to go to the emergency department.  No follow-ups on file.  Tarick Parenteau [place]  Thompson, DNP [facility] [address] [place], [place] [address] [phone]  Note: This document was prepared by [employer] voice dictation technology and any errors that results from this process are unintentional.

## 2024-02-23 NOTE — Telephone Encounter (Signed)
 E2C2 scheduled appointment.

## 2024-02-23 NOTE — Telephone Encounter (Signed)
 Chief Complaint: Lower back pain since last Wednesday night  Symptoms: Pain with walking where leg gives out and stumble, 10/10 pain Frequency: "Steady," when laying still it relaxes Pertinent Negatives: Patient denies nausea, pain radiation, weakness, numbness, problems with bowel/bladder control, abdomen pain, burning with urination, fever, blood in urine  Disposition: [] ED /[] Urgent Care (no appt availability in office) / [x] Appointment(In office/virtual)/ []  [facility]/ [] Home Care/ [] Refused Recommended Disposition /[] [facility]/ []  Follow-up with PCP  Additional Notes: Patient has tried heat, lidocaine, naproxen , and a massage but nothing will help. Pt states it hurts to walk, twist, or move. Pt is not sure what is causing it and it has not happened before.    Copied from CRM [id number]. Topic: Clinical - Red Word Triage >> Feb 23, 2024 12:45 PM Carrielelia G wrote: Red Word that prompted transfer to Nurse Triage: severe lower back pain, has gotten worse (5 days) Reason for Disposition  [1] SEVERE back pain (e.g., excruciating, unable to do any normal activities) AND [2] not improved 2 hours after pain medicine  Answer Assessment - Initial Assessment Questions Chief Complaint: Lower back pain since last Wednesday night  Symptoms: Pain with walking where leg gives out and stumble, 10/10 pain Frequency: "Steady," when laying still it relaxes  Pertinent Negatives: Patient denies nausea, pain radiation, weakness, numbness, problems with bowel/bladder control, abdomen pain, burning with urination, fever, blood in urine  Protocols used: Back Pain-A-AH

## 2024-02-26 ENCOUNTER — Other Ambulatory Visit: Payer: Self-pay | Admitting: Family Medicine

## 2024-02-26 DIAGNOSIS — F411 Generalized anxiety disorder: Secondary | ICD-10-CM

## 2024-03-31 ENCOUNTER — Ambulatory Visit (INDEPENDENT_AMBULATORY_CARE_PROVIDER_SITE_OTHER): Payer: 59 | Admitting: Family Medicine

## 2024-03-31 ENCOUNTER — Encounter: Payer: Self-pay | Admitting: Family Medicine

## 2024-03-31 DIAGNOSIS — E782 Mixed hyperlipidemia: Secondary | ICD-10-CM

## 2024-03-31 DIAGNOSIS — L853 Xerosis cutis: Secondary | ICD-10-CM | POA: Diagnosis not present

## 2024-03-31 DIAGNOSIS — E039 Hypothyroidism, unspecified: Secondary | ICD-10-CM

## 2024-03-31 DIAGNOSIS — G4733 Obstructive sleep apnea (adult) (pediatric): Secondary | ICD-10-CM | POA: Diagnosis not present

## 2024-03-31 DIAGNOSIS — K760 Fatty (change of) liver, not elsewhere classified: Secondary | ICD-10-CM

## 2024-03-31 DIAGNOSIS — Z6837 Body mass index (BMI) 37.0-37.9, adult: Secondary | ICD-10-CM | POA: Diagnosis not present

## 2024-03-31 DIAGNOSIS — F325 Major depressive disorder, single episode, in full remission: Secondary | ICD-10-CM

## 2024-03-31 MED ORDER — ZEPBOUND 10 MG/0.5ML ~~LOC~~ SOAJ: 10.0000 mg | SUBCUTANEOUS | 0 refills | Status: DC

## 2024-03-31 MED ORDER — ZEPBOUND 2.5 MG/0.5ML ~~LOC~~ SOAJ: 2.5000 mg | SUBCUTANEOUS | 0 refills | Status: DC

## 2024-03-31 MED ORDER — ZEPBOUND 7.5 MG/0.5ML ~~LOC~~ SOAJ: 7.5000 mg | SUBCUTANEOUS | 0 refills | Status: DC

## 2024-03-31 MED ORDER — ZEPBOUND 5 MG/0.5ML ~~LOC~~ SOAJ: 5.0000 mg | SUBCUTANEOUS | 0 refills | Status: DC

## 2024-03-31 NOTE — Progress Notes (Signed)
 Subjective: Krystal Mckee recheck PCP: Eliodoro Guerin, DO Krystal Mckee is a 48 y.o. female presenting to clinic today for:  1. Morbid obesity w/ OSA, hyperlipidemia, fatty liver Started on Zepbound  4 months ago.  Note she never actually got it because she was told it be over thousand dollars.  She did not try submitting the co-pay card with that but she is signed up for and will do that today.  She reports ongoing fatigue  2.  Dry skin/hypothyroidism Compliant with thyroid  medication.  No missed doses.  She just having general dryness of the skin as well as some emotional lability despite compliance with Paxil  and a good moisturization regimen.  She has history of partial hysterectomy where she had her ovaries spared and that was done over 20 years ago.  Would like to have her hormones checked if possible   ROS: Per HPI  Allergies  Allergen Reactions   Penicillins Anaphylaxis and Swelling    Has patient had a PCN reaction causing immediate rash, facial/tongue/throat swelling, SOB or lightheadedness with hypotension: Yes Has patient had a PCN reaction causing severe rash involving mucus membranes or skin necrosis: Yes Has patient had a PCN reaction that required hospitalization No Has patient had a PCN reaction occurring within the last 10 years: No If all of the above answers are NO, then may proceed with Cephalosporin use.    Zoloft  [Sertraline  Hcl] Itching and Rash   Past Medical History:  Diagnosis Date   Acute midline low back pain without sciatica 02/23/2024   Anxiety    Colon polyps    Depression    Hypothyroidism    Sleep apnea    Syncope 02/18/2013    Current Outpatient Medications:    cyclobenzaprine  (FLEXERIL ) 5 MG tablet, Take 1 tablet (5 mg total) by mouth at bedtime., Disp: 30 tablet, Rfl: 1   fluticasone  (FLONASE ) 50 MCG/ACT nasal spray, Place 2 sprays into both nostrils daily., Disp: 16 g, Rfl: 6   hydrOXYzine  (VISTARIL ) 25 MG capsule, Take 1  capsule (25 mg total) by mouth at bedtime as needed., Disp: 90 capsule, Rfl: 3   levothyroxine  (SYNTHROID ) 112 MCG tablet, Take 1 tablet (112 mcg total) by mouth daily., Disp: 90 tablet, Rfl: 3   PARoxetine  (PAXIL ) 30 MG tablet, TAKE 2 TABLETS BY MOUTH EVERY MORNING, Disp: 180 tablet, Rfl: 0   tirzepatide  (ZEPBOUND ) 10 MG/0.5ML Pen, Inject 10 mg into the skin once a week. Month #4, Disp: 2 mL, Rfl: 0   tirzepatide  (ZEPBOUND ) 2.5 MG/0.5ML Pen, Inject 2.5 mg into the skin once a week. Month #1, Disp: 2 mL, Rfl: 0   tirzepatide  (ZEPBOUND ) 5 MG/0.5ML Pen, Inject 5 mg into the skin once a week. Month #2, Disp: 2 mL, Rfl: 0   tirzepatide  (ZEPBOUND ) 7.5 MG/0.5ML Pen, Inject 7.5 mg into the skin once a week. Month #3, Disp: 2 mL, Rfl: 0   Vitamin D , Ergocalciferol , (DRISDOL ) 1.25 MG (50000 UNIT) CAPS capsule, Take 1 capsule (50,000 Units total) by mouth every 7 (seven) days., Disp: 12 capsule, Rfl: 3 Social History   Socioeconomic History   Marital status: Single    Spouse name: Not on file   Number of children: 3   Years of education: college   Highest education level: Bachelor's degree (e.g., BA, AB, BS)  Occupational History    Employer: [employer]  Tobacco Use   Smoking status: Former    Current packs/day: 0.00    Average packs/day: 0.3 packs/day for 5.0 years (1.3  ttl pk-yrs)    Types: Cigarettes    Start date: 06/21/2016    Quit date: 06/21/2021    Years since quitting: 2.7   Smokeless tobacco: Never  Vaping Use   Vaping status: Never Used  Substance and Sexual Activity   Alcohol use: No   Drug use: No   Sexual activity: Yes    Birth control/protection: Other-see comments    Comment: TVH 2001  Other Topics Concern   Not on file  Social History Narrative   Not on file   Social Drivers of Health   Financial Resource Strain: Low Risk  (12/01/2023)   Overall Financial Resource Strain (CARDIA)    Difficulty of Paying Living Expenses: Not hard at all  Food Insecurity: No Food Insecurity  (02/28/2024)   Received from [facility]   Hunger Vital Sign    Worried About Running Out of Food in the Last Year: Never true    Ran Out of Food in the Last Year: Never true  Transportation Needs: No Transportation Needs (02/28/2024)   Received from [facility] - Transportation    Lack of Transportation (Medical): No    Lack of Transportation (Non-Medical): No  Physical Activity: Sufficiently Active (12/01/2023)   Exercise Vital Sign    Days of Exercise per Week: 5 days    Minutes of Exercise per Session: 70 min  Stress: Stress Concern Present (12/01/2023)   [employer] of Occupational Health - Occupational Stress Questionnaire    Feeling of Stress : To some extent  Social Connections: Socially Isolated (12/01/2023)   Social Connection and Isolation Panel [NHANES]    Frequency of Communication with Friends and Family: More than three times a week    Frequency of Social Gatherings with Friends and Family: More than three times a week    Attends Religious Services: Never    Database administrator or Organizations: No    Attends Engineer, structural: Not on file    Marital Status: Divorced  Intimate Partner Violence: Unknown (07/18/2022)   Received from [facility]   HITS    Physically Hurt: Not on file    Insult or Talk Down To: Not on file    Threaten Physical Harm: Not on file    Scream or Curse: Not on file   Family History  Problem Relation Age of Onset   Skin cancer Mother    Ovarian cancer Mother    Colon polyps Father    Hepatitis C Father    Skin cancer Father    Diabetes Maternal Grandmother    Narcolepsy Son    Colon cancer Neg Hx    Pancreatic cancer Neg Hx    Stomach cancer Neg Hx    Esophageal cancer Neg Hx    Sleep apnea Neg Hx     Objective: Office vital signs reviewed. BP 114/77   Pulse 100   Temp 98.5 F (36.9 C)   Ht 5' 4 (1.626 m)   Wt 218 lb (98.9 kg)   SpO2 95%   BMI 37.42 kg/m   Physical  Examination:  General: Awake, alert, morbidly obese, No acute distress HEENT: No exophthalmos, no goiter Cardio: regular rate and rhythm, S1S2 heard, no murmurs appreciated Pulm: clear to auscultation bilaterally, no wheezes, rhonchi or rales; normal work of breathing on room air Skin: Dry but no skin breakdown  Assessment/ Plan: 48 y.o. female   Morbid obesity (HCC) - Plan: tirzepatide  (ZEPBOUND ) 10 MG/0.5ML  Pen, tirzepatide  (ZEPBOUND ) 2.5 MG/0.5ML Pen, tirzepatide  (ZEPBOUND ) 7.5 MG/0.5ML Pen, tirzepatide  (ZEPBOUND ) 5 MG/0.5ML Pen  BMI 37.0-37.9, adult - Plan: tirzepatide  (ZEPBOUND ) 10 MG/0.5ML Pen, tirzepatide  (ZEPBOUND ) 2.5 MG/0.5ML Pen, tirzepatide  (ZEPBOUND ) 7.5 MG/0.5ML Pen, tirzepatide  (ZEPBOUND ) 5 MG/0.5ML Pen  OSA on CPAP - Plan: tirzepatide  (ZEPBOUND ) 10 MG/0.5ML Pen, tirzepatide  (ZEPBOUND ) 2.5 MG/0.5ML Pen, tirzepatide  (ZEPBOUND ) 7.5 MG/0.5ML Pen, tirzepatide  (ZEPBOUND ) 5 MG/0.5ML Pen  Dry skin - Plan: FSH/LH, TSH + free T4  Hypothyroidism, unspecified type - Plan: TSH + free T4  Moderate mixed hyperlipidemia not requiring statin therapy - Plan: tirzepatide  (ZEPBOUND ) 10 MG/0.5ML Pen, tirzepatide  (ZEPBOUND ) 2.5 MG/0.5ML Pen, tirzepatide  (ZEPBOUND ) 7.5 MG/0.5ML Pen, tirzepatide  (ZEPBOUND ) 5 MG/0.5ML Pen  Fatty liver - Plan: tirzepatide  (ZEPBOUND ) 10 MG/0.5ML Pen, tirzepatide  (ZEPBOUND ) 2.5 MG/0.5ML Pen, tirzepatide  (ZEPBOUND ) 7.5 MG/0.5ML Pen, tirzepatide  (ZEPBOUND ) 5 MG/0.5ML Pen  Major depressive disorder with single episode, in full remission (HCC) - Plan: tirzepatide  (ZEPBOUND ) 10 MG/0.5ML Pen, tirzepatide  (ZEPBOUND ) 2.5 MG/0.5ML Pen, tirzepatide  (ZEPBOUND ) 7.5 MG/0.5ML Pen, tirzepatide  (ZEPBOUND ) 5 MG/0.5ML Pen  I am reordering the Zepbound .  I did go ahead and tell her to bring that coupon card to them because I suspect she has some level of deductible to pay down and I believe that coupon card will actually help her do that.  I will order FSH and LH along with her thyroid   labs to see maybe if she does have some type of metabolic etiology of her dry skin.  She will continue all medications as prescribed for now   Eliodoro Guerin, DO [facility] [phone]

## 2024-03-31 NOTE — Patient Instructions (Signed)
 Tips for success with Zepbound  (and by success, how not to be super sick on your stomach): Eat small meals AVOID heavy foods (fried/ high in carbs like bread, pasta, rice) AVOID carbonated beverages (soda/ beer, as these can increase bloating) DOUBLE your water intake (will help you avoid constipation/ dehydration) INCREASE protein intake to avoid muscle mass loss.  Zepbound  CAN cause: Nausea Abdominal pain Increased acid reflux (sometimes presents as sour burps) Constipation OR Diarrhea Fatigue (especially when you first start it)

## 2024-04-01 ENCOUNTER — Ambulatory Visit: Payer: Self-pay | Admitting: Family Medicine

## 2024-04-01 DIAGNOSIS — E039 Hypothyroidism, unspecified: Secondary | ICD-10-CM

## 2024-04-01 LAB — TSH+FREE T4
Free T4: 0.92 ng/dL (ref 0.82–1.77)
TSH: 3.93 u[IU]/mL (ref 0.450–4.500)

## 2024-04-01 LAB — FSH/LH
FSH: 11.4 m[IU]/mL
LH: 16.8 m[IU]/mL

## 2024-04-01 MED ORDER — LEVOTHYROXINE SODIUM 125 MCG PO TABS
125.0000 ug | ORAL_TABLET | Freq: Every day | ORAL | 3 refills | Status: AC
Start: 2024-04-01 — End: ?

## 2024-04-27 ENCOUNTER — Other Ambulatory Visit: Payer: Self-pay | Admitting: Nurse Practitioner

## 2024-04-27 DIAGNOSIS — M545 Low back pain, unspecified: Secondary | ICD-10-CM

## 2024-06-23 ENCOUNTER — Encounter: Payer: Self-pay | Admitting: Family Medicine

## 2024-06-23 DIAGNOSIS — G8929 Other chronic pain: Secondary | ICD-10-CM

## 2024-07-21 ENCOUNTER — Ambulatory Visit: Admitting: Orthopedic Surgery

## 2024-07-21 ENCOUNTER — Other Ambulatory Visit: Payer: Self-pay | Admitting: Family Medicine

## 2024-07-21 DIAGNOSIS — F411 Generalized anxiety disorder: Secondary | ICD-10-CM

## 2024-07-21 DIAGNOSIS — F331 Major depressive disorder, recurrent, moderate: Secondary | ICD-10-CM

## 2024-07-21 MED ORDER — DESVENLAFAXINE SUCCINATE ER 50 MG PO TB24: 50.0000 mg | ORAL_TABLET | Freq: Every day | ORAL | 0 refills | Status: DC

## 2024-07-30 NOTE — Patient Instructions (Addendum)
 Our records indicate that you are due for your screening mammogram.  Please call the imaging center that does your yearly mammograms to make an appointment for a mammogram at your earliest convenience. Our office also has a mobile unit through the [facility] that comes to our location. Please call [phone] or stop at check out if you would like to make an appointment.  Tips for success with Wegovy (and by success, how not to be super sick on your stomach): Eat small meals AVOID heavy foods (fried/ high in carbs like bread, pasta, rice) AVOID carbonated beverages (soda/ beer, as these can increase bloating) DOUBLE your water intake (will help you avoid constipation/ dehydration)  Tzhncb CAN cause: Nausea Abdominal pain Increased acid reflux (sometimes presents as sour burps) Constipation OR Diarrhea Fatigue (especially when you first start it)

## 2024-08-03 ENCOUNTER — Ambulatory Visit (INDEPENDENT_AMBULATORY_CARE_PROVIDER_SITE_OTHER): Admitting: Family Medicine

## 2024-08-03 DIAGNOSIS — Z6837 Body mass index (BMI) 37.0-37.9, adult: Secondary | ICD-10-CM

## 2024-08-03 DIAGNOSIS — G4733 Obstructive sleep apnea (adult) (pediatric): Secondary | ICD-10-CM

## 2024-08-03 DIAGNOSIS — E039 Hypothyroidism, unspecified: Secondary | ICD-10-CM | POA: Diagnosis not present

## 2024-08-03 DIAGNOSIS — F411 Generalized anxiety disorder: Secondary | ICD-10-CM

## 2024-08-03 DIAGNOSIS — F331 Major depressive disorder, recurrent, moderate: Secondary | ICD-10-CM

## 2024-08-03 MED ORDER — SEMAGLUTIDE-WEIGHT MANAGEMENT 1.7 MG/0.75ML ~~LOC~~ SOAJ: 1.7000 mg | SUBCUTANEOUS | 0 refills | Status: AC

## 2024-08-03 MED ORDER — SEMAGLUTIDE-WEIGHT MANAGEMENT 0.25 MG/0.5ML ~~LOC~~ SOAJ: 0.2500 mg | SUBCUTANEOUS | 0 refills | Status: AC

## 2024-08-03 MED ORDER — SEMAGLUTIDE-WEIGHT MANAGEMENT 0.5 MG/0.5ML ~~LOC~~ SOAJ: 0.5000 mg | SUBCUTANEOUS | 0 refills | Status: AC

## 2024-08-03 MED ORDER — SEMAGLUTIDE-WEIGHT MANAGEMENT 1 MG/0.5ML ~~LOC~~ SOAJ: 1.0000 mg | SUBCUTANEOUS | 0 refills | Status: AC

## 2024-08-03 MED ORDER — SEMAGLUTIDE-WEIGHT MANAGEMENT 2.4 MG/0.75ML ~~LOC~~ SOAJ: 2.4000 mg | SUBCUTANEOUS | 0 refills | Status: AC

## 2024-08-03 MED ORDER — ONDANSETRON 4 MG PO TBDP: 4.0000 mg | ORAL_TABLET | Freq: Three times a day (TID) | ORAL | 0 refills | Status: AC | PRN

## 2024-08-03 NOTE — Progress Notes (Signed)
 Subjective: CC: Weight loss encounter PCP: Jolinda Norene HERO, DO YEP:Krystal Mckee is a 48 y.o. female presenting to clinic today for:  Morbid obesity associated with obstructive sleep apnea major depressive disorder Patient reports that since starting the Pristiq her mood has gotten better and the dizziness has resolved.  She had been tapered off of Paxil  and then MyChart message to inform that she was having concerning features of withdrawal.  She has had no adverse side effects from the Pristiq so far and reports actually some appetite suppression which she is thrilled about.  Sadly, her insurance would not cover the Zepbound .  They apparently prefer [facility] and she is asking to maybe discuss switching to that.   ROS: Per HPI  Allergies  Allergen Reactions   Penicillins Anaphylaxis and Swelling    Has patient had a PCN reaction causing immediate rash, facial/tongue/throat swelling, SOB or lightheadedness with hypotension: Yes Has patient had a PCN reaction causing severe rash involving mucus membranes or skin necrosis: Yes Has patient had a PCN reaction that required hospitalization No Has patient had a PCN reaction occurring within the last 10 years: No If all of the above answers are NO, then may proceed with Cephalosporin use.    Zoloft  [Sertraline  Hcl] Itching and Rash   Past Medical History:  Diagnosis Date   Acute midline low back pain without sciatica 02/23/2024   Anxiety    Colon polyps    Depression    Hypothyroidism    Sleep apnea    Syncope 02/18/2013    Current Outpatient Medications:    cyclobenzaprine  (FLEXERIL ) 5 MG tablet, TAKE 1 TABLET BY MOUTH EVERYDAY AT BEDTIME, Disp: 90 tablet, Rfl: 3   desvenlafaxine (PRISTIQ) 50 MG 24 hr tablet, Take 1 tablet (50 mg total) by mouth daily. To replace paxil , Disp: 90 tablet, Rfl: 0   fluticasone  (FLONASE ) 50 MCG/ACT nasal spray, Place 2 sprays into both nostrils daily., Disp: 16 g, Rfl: 6   hydrOXYzine   (VISTARIL ) 25 MG capsule, Take 1 capsule (25 mg total) by mouth at bedtime as needed., Disp: 90 capsule, Rfl: 3   levothyroxine  (SYNTHROID ) 125 MCG tablet, Take 1 tablet (125 mcg total) by mouth daily. Dose change, Disp: 90 tablet, Rfl: 3   tirzepatide  (ZEPBOUND ) 10 MG/0.5ML Pen, Inject 10 mg into the skin once a week. Month #4, Disp: 2 mL, Rfl: 0   tirzepatide  (ZEPBOUND ) 2.5 MG/0.5ML Pen, Inject 2.5 mg into the skin once a week. Month #1, Disp: 2 mL, Rfl: 0   tirzepatide  (ZEPBOUND ) 5 MG/0.5ML Pen, Inject 5 mg into the skin once a week. Month #2, Disp: 2 mL, Rfl: 0   tirzepatide  (ZEPBOUND ) 7.5 MG/0.5ML Pen, Inject 7.5 mg into the skin once a week. Month #3, Disp: 2 mL, Rfl: 0   Vitamin D , Ergocalciferol , (DRISDOL ) 1.25 MG (50000 UNIT) CAPS capsule, Take 1 capsule (50,000 Units total) by mouth every 7 (seven) days., Disp: 12 capsule, Rfl: 3 Social History   Socioeconomic History   Marital status: Single    Spouse name: Not on file   Number of children: 3   Years of education: college   Highest education level: Bachelor's degree (e.g., BA, AB, BS)  Occupational History    Employer: [employer]  Tobacco Use   Smoking status: Former    Current packs/day: 0.00    Average packs/day: 0.3 packs/day for 5.0 years (1.3 ttl pk-yrs)    Types: Cigarettes    Start date: 06/21/2016    Quit date: 06/21/2021  Years since quitting: 3.1   Smokeless tobacco: Never  Vaping Use   Vaping status: Never Used  Substance and Sexual Activity   Alcohol use: No   Drug use: No   Sexual activity: Yes    Birth control/protection: Other-see comments    Comment: TVH 2001  Other Topics Concern   Not on file  Social History Narrative   Not on file   Social Drivers of Health   Financial Resource Strain: Low Risk  (08/03/2024)   Overall Financial Resource Strain (CARDIA)    Difficulty of Paying Living Expenses: Not hard at all  Food Insecurity: No Food Insecurity (08/03/2024)   Hunger Vital Sign    Worried About  Running Out of Food in the Last Year: Never true    Ran Out of Food in the Last Year: Never true  Transportation Needs: No Transportation Needs (08/03/2024)   PRAPARE - Administrator, Civil Service (Medical): No    Lack of Transportation (Non-Medical): No  Physical Activity: Inactive (08/03/2024)   Exercise Vital Sign    Days of Exercise per Week: 0 days    Minutes of Exercise per Session: Not on file  Stress: Stress Concern Present (08/03/2024)   [employer] of Occupational Health - Occupational Stress Questionnaire    Feeling of Stress: To some extent  Social Connections: Socially Isolated (08/03/2024)   Social Connection and Isolation Panel    Frequency of Communication with Friends and Family: More than three times a week    Frequency of Social Gatherings with Friends and Family: Three times a week    Attends Religious Services: Patient declined    Active Member of Clubs or Organizations: No    Attends Banker Meetings: Not on file    Marital Status: Divorced  Intimate Partner Violence: Not At Risk (05/05/2024)   Received from [facility]   Humiliation, Afraid, Rape, and Kick questionnaire    Within the last year, have you been afraid of your partner or ex-partner?: No    Within the last year, have you been humiliated or emotionally abused in other ways by your partner or ex-partner?: No    Within the last year, have you been kicked, hit, slapped, or otherwise physically hurt by your partner or ex-partner?: No    Within the last year, have you been raped or forced to have any kind of sexual activity by your partner or ex-partner?: No   Family History  Problem Relation Age of Onset   Skin cancer Mother    Ovarian cancer Mother    Colon polyps Father    Hepatitis C Father    Skin cancer Father    Diabetes Maternal Grandmother    Narcolepsy Son    Colon cancer Neg Hx    Pancreatic cancer Neg Hx    Stomach cancer Neg Hx    Esophageal cancer  Neg Hx    Sleep apnea Neg Hx     Objective: Office vital signs reviewed. BP 110/74   Pulse 97   Temp 97.8 F (36.6 C)   Ht 5' 4 (1.626 m)   Wt 217 lb 3.2 oz (98.5 kg)   SpO2 95%   BMI 37.28 kg/m   Physical Examination:  General: Awake, alert, well-appearing morbidly obese female, No acute distress HEENT: Sclera white.  Moist mucous membranes Cardio: regular rate and rhythm, S1S2 heard, no murmurs appreciated Pulm: clear to auscultation bilaterally, no wheezes, rhonchi or rales; normal work of breathing on  room air     08/03/2024    3:57 PM 03/31/2024    4:00 PM 12/01/2023    3:49 PM  Depression screen PHQ 2/9  Decreased Interest 1 1 1   Down, Depressed, Hopeless 1 1 1   PHQ - 2 Score 2 2 2   Altered sleeping 2 3 1   Tired, decreased energy 2 2 3   Change in appetite 0 0 0  Feeling bad or failure about yourself  1 3 0  Trouble concentrating 1 3 3   Moving slowly or fidgety/restless 0 0 1  Suicidal thoughts 0 0 0  PHQ-9 Score 8 13 10   Difficult doing work/chores Not difficult at all Somewhat difficult Somewhat difficult      08/03/2024    3:57 PM 03/31/2024    3:56 PM 12/01/2023    3:48 PM 11/06/2022    4:11 PM  GAD 7 : Generalized Anxiety Score  Nervous, Anxious, on Edge 2 3 1    Control/stop worrying 1 1 2 1   Worry too much - different things 1 1 2 1   Trouble relaxing 2 2 2 2   Restless 0 2 1 2   Easily annoyed or irritable 1 3 2 1   Afraid - awful might happen 0 0 0 1  Total GAD 7 Score 7 12 10    Anxiety Difficulty Somewhat difficult Somewhat difficult  Somewhat difficult    Assessment/ Plan: 48 y.o. female   Morbid obesity (HCC) - Plan: semaglutide-weight management (WEGOVY) 0.25 MG/0.5ML SOAJ SQ injection, semaglutide-weight management (WEGOVY) 0.5 MG/0.5ML SOAJ SQ injection, semaglutide-weight management (WEGOVY) 1 MG/0.5ML SOAJ SQ injection, semaglutide-weight management (WEGOVY) 1.7 MG/0.75ML SOAJ SQ injection, semaglutide-weight management (WEGOVY) 2.4 MG/0.75ML  SOAJ SQ injection, ondansetron  (ZOFRAN -ODT) 4 MG disintegrating tablet  BMI 37.0-37.9, adult - Plan: semaglutide-weight management (WEGOVY) 0.25 MG/0.5ML SOAJ SQ injection, semaglutide-weight management (WEGOVY) 0.5 MG/0.5ML SOAJ SQ injection, semaglutide-weight management (WEGOVY) 1 MG/0.5ML SOAJ SQ injection, semaglutide-weight management (WEGOVY) 1.7 MG/0.75ML SOAJ SQ injection, semaglutide-weight management (WEGOVY) 2.4 MG/0.75ML SOAJ SQ injection, ondansetron  (ZOFRAN -ODT) 4 MG disintegrating tablet  OSA on CPAP - Plan: semaglutide-weight management (WEGOVY) 0.25 MG/0.5ML SOAJ SQ injection, semaglutide-weight management (WEGOVY) 0.5 MG/0.5ML SOAJ SQ injection, semaglutide-weight management (WEGOVY) 1 MG/0.5ML SOAJ SQ injection, semaglutide-weight management (WEGOVY) 1.7 MG/0.75ML SOAJ SQ injection, semaglutide-weight management (WEGOVY) 2.4 MG/0.75ML SOAJ SQ injection  Acquired hypothyroidism - Plan: semaglutide-weight management (WEGOVY) 0.25 MG/0.5ML SOAJ SQ injection, semaglutide-weight management (WEGOVY) 0.5 MG/0.5ML SOAJ SQ injection, semaglutide-weight management (WEGOVY) 1 MG/0.5ML SOAJ SQ injection, semaglutide-weight management (WEGOVY) 1.7 MG/0.75ML SOAJ SQ injection, semaglutide-weight management (WEGOVY) 2.4 MG/0.75ML SOAJ SQ injection  Moderate episode of recurrent major depressive disorder (HCC)  GAD (generalized anxiety disorder)   Patient's BMI is >30 mg/m2.  Patient's current BMI is Body mass index is 37.28 kg/m.SABRA  Patient is currently enrolled in a healthy eating plan along with encouraged exercise.  Patient has contraindications to phentermine, Contrave & Qsymia (contains phentermine).  Patient does not have a personal or family history of medullary thyroid  carcinoma (MTC) or Multiple Endocrine Neoplasia syndrome type 2 (MEN 2).  Comorbidities include depressive disorder, hypothyroidism and MODERATE obstructive sleep apnea (see January 22, 2023 [place] neurologic/sleep medicine  note for details)  I demonstrated how to inject appropriately here in office.  She had no questions or concerns today.  Depression and anxiety are under much better control with Pristiq.  Will continue at 50 mg for now and reassess again in 3 months along with weight check.  Okay for virtual visit if she desires  Norene CHRISTELLA Fielding,  DO [facility] [phone]

## 2024-10-11 ENCOUNTER — Other Ambulatory Visit: Payer: Self-pay | Admitting: Family Medicine

## 2024-10-11 DIAGNOSIS — F331 Major depressive disorder, recurrent, moderate: Secondary | ICD-10-CM

## 2024-10-11 DIAGNOSIS — F411 Generalized anxiety disorder: Secondary | ICD-10-CM

## 2024-11-03 ENCOUNTER — Ambulatory Visit: Payer: Self-pay | Admitting: Family Medicine
